# Patient Record
Sex: Female | Born: 1995 | Race: Black or African American | Hispanic: No | Marital: Single | Smoking: Current some day smoker
Health system: Southern US, Community
[De-identification: ages and names within clinical notes are randomized; demographics above are authoritative.]

## PROBLEM LIST (undated history)

## (undated) ENCOUNTER — Inpatient Hospital Stay: Payer: Self-pay

## (undated) DIAGNOSIS — F419 Anxiety disorder, unspecified: Secondary | ICD-10-CM

## (undated) DIAGNOSIS — G4700 Insomnia, unspecified: Secondary | ICD-10-CM

## (undated) HISTORY — PX: NO PAST SURGERIES: SHX2092

---
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
## 2016-01-19 ENCOUNTER — Encounter: Payer: Self-pay | Admitting: *Deleted

## 2016-01-19 ENCOUNTER — Emergency Department
Admission: EM | Admit: 2016-01-19 | Discharge: 2016-01-19 | Disposition: A | Discharge status: Home / Self Care | Payer: Self-pay | Attending: Emergency Medicine | Admitting: Emergency Medicine

## 2016-01-19 DIAGNOSIS — O039 Complete or unspecified spontaneous abortion without complication: Secondary | ICD-10-CM | POA: Insufficient documentation

## 2016-01-19 DIAGNOSIS — Z880 Allergy status to penicillin: Secondary | ICD-10-CM | POA: Insufficient documentation

## 2016-01-19 DIAGNOSIS — Z87891 Personal history of nicotine dependence: Secondary | ICD-10-CM | POA: Insufficient documentation

## 2016-01-19 DIAGNOSIS — Z79899 Other long term (current) drug therapy: Secondary | ICD-10-CM | POA: Insufficient documentation

## 2016-01-19 MED ORDER — MORPHINE SULFATE (PF) 4 MG/ML IV SOLN
4.0000 mg | Freq: Once | INTRAVENOUS | Status: AC
  Administered 2016-01-19: 4 mg via INTRAVENOUS

## 2016-01-19 MED ORDER — MISOPROSTOL 200 MCG PO TABS
800.0000 ug | ORAL_TABLET | Freq: Once | ORAL | Status: AC
  Administered 2016-01-19: 800 ug via ORAL
  Filled 2016-01-19: qty 4

## 2016-01-19 MED ORDER — MORPHINE SULFATE (PF) 4 MG/ML IV SOLN
INTRAVENOUS | Status: AC
  Filled 2016-01-19: qty 1

## 2016-01-19 NOTE — ED Notes (Signed)
Placenta delivered and placed in formalin container by dr Bonney Aid.  Mother continues to hold fetus.  Lower abdomen massaged by dr.staebler.

## 2016-01-19 NOTE — Discharge Instructions (Signed)
Please seek medical attention for any high fevers, chest pain, shortness of breath, change in behavior, persistent vomiting, bloody stool or any other new or concerning symptoms.   Miscarriage A miscarriage is the sudden loss of an unborn baby (fetus) before the 20th week of pregnancy. Most miscarriages happen in the first 3 months of pregnancy. Sometimes, it happens before a woman even knows she is pregnant. A miscarriage is also called a "spontaneous miscarriage" or "early pregnancy loss." Having a miscarriage can be an emotional experience. Talk with your caregiver about any questions you may have about miscarrying, the grieving process, and your future pregnancy plans. CAUSES   Problems with the fetal chromosomes that make it impossible for the baby to develop normally. Problems with the baby's genes or chromosomes are most often the result of errors that occur, by chance, as the embryo divides and grows. The problems are not inherited from the parents.  Infection of the cervix or uterus.   Hormone problems.   Problems with the cervix, such as having an incompetent cervix. This is when the tissue in the cervix is not strong enough to hold the pregnancy.   Problems with the uterus, such as an abnormally shaped uterus, uterine fibroids, or congenital abnormalities.   Certain medical conditions.   Smoking, drinking alcohol, or taking illegal drugs.   Trauma.  Often, the cause of a miscarriage is unknown.  SYMPTOMS   Vaginal bleeding or spotting, with or without cramps or pain.  Pain or cramping in the abdomen or lower back.  Passing fluid, tissue, or blood clots from the vagina. DIAGNOSIS  Your caregiver will perform a physical exam. You may also have an ultrasound to confirm the miscarriage. Blood or urine tests may also be ordered. TREATMENT   Sometimes, treatment is not necessary if you naturally pass all the fetal tissue that was in the uterus. If some of the fetus or  placenta remains in the body (incomplete miscarriage), tissue left behind may become infected and must be removed. Usually, a dilation and curettage (D and C) procedure is performed. During a D and C procedure, the cervix is widened (dilated) and any remaining fetal or placental tissue is gently removed from the uterus.  Antibiotic medicines are prescribed if there is an infection. Other medicines may be given to reduce the size of the uterus (contract) if there is a lot of bleeding.  If you have Rh negative blood and your baby was Rh positive, you will need a Rh immunoglobulin shot. This shot will protect any future baby from having Rh blood problems in future pregnancies. HOME CARE INSTRUCTIONS   Your caregiver may order bed rest or may allow you to continue light activity. Resume activity as directed by your caregiver.  Have someone help with home and family responsibilities during this time.   Keep track of the number of sanitary pads you use each day and how soaked (saturated) they are. Write down this information.   Do not use tampons. Do not douche or have sexual intercourse until approved by your caregiver.   Only take over-the-counter or prescription medicines for pain or discomfort as directed by your caregiver.   Do not take aspirin. Aspirin can cause bleeding.   Keep all follow-up appointments with your caregiver.   If you or your partner have problems with grieving, talk to your caregiver or seek counseling to help cope with the pregnancy loss. Allow enough time to grieve before trying to get pregnant again.  SEEK  IMMEDIATE MEDICAL CARE IF:   You have severe cramps or pain in your back or abdomen.  You have a fever.  You pass large blood clots (walnut-sized or larger) ortissue from your vagina. Save any tissue for your caregiver to inspect.   Your bleeding increases.   You have a thick, bad-smelling vaginal discharge.  You become lightheaded, weak, or you  faint.   You have chills.  MAKE SURE YOU:  Understand these instructions.  Will watch your condition.  Will get help right away if you are not doing well or get worse.   This information is not intended to replace advice given to you by your health care provider. Make sure you discuss any questions you have with your health care provider.   Document Released: 05/08/2001 Document Revised: 03/09/2013 Document Reviewed: 01/01/2012 Elsevier Interactive Patient Education [employer]

## 2016-01-19 NOTE — ED Notes (Signed)
Iv inserted by ems en route with normal saline infusing.

## 2016-01-19 NOTE — Progress Notes (Signed)
°   01/19/16 1900  Clinical Encounter Type  Visited With Health care provider  Visit Type Initial  Referral From Nurse  Consult/Referral To Chaplain  Responded to page but was unable to see patient due to her medical event. Waited for family member until called to another emergency. Chaplain [employer] Ext [phone]

## 2016-01-19 NOTE — ED Notes (Signed)
1915, delivery of fetus by dr Bonney Aid. Umbilical  cord clamped by dr Bonney Aid.  Mother holding fetus.  Iv fluids infusing.

## 2016-01-19 NOTE — ED Notes (Signed)
Nursing supervisor stephanie rn in with pt to sign papers for [employer].  Iv fluids infusing.  Pt alert. Calm and cooperative.

## 2016-01-19 NOTE — ED Notes (Addendum)
Pain meds given.  Pt vomiting.  Iv fluids infusing.  No nausea meds at this time per dr Bonney Aid.  Pt continues to deliver fetus.  Dr Bonney Aid at bedside assisting with delivery.

## 2016-01-19 NOTE — ED Notes (Signed)
Fetus placed in shroud infant bag and with identification stickers.  Fetus taken to morgue by stephanie rn nursing supervisor.

## 2016-01-19 NOTE — ED Notes (Addendum)
On arrival to treatment room, g1p0a0.  Pt reports she is approx [redacted] weeks pregnant.  edc-august 2017. Pt with breech presentation, both legs of fetus protruding from vaginal opening.  Labor and delivery nurse jamie and dr Bonney Aid at bedside to assist with delivery.  Iv fluids infusing.

## 2016-01-19 NOTE — ED Notes (Signed)
3rd liter normal saline infusing now.

## 2016-01-19 NOTE — H&P (Signed)
Obstetrics & Gynecology Consult H&P    Consulting Department: Emergency  Consulting Physician: Phineas Semen, MD   Consulting Question: EMS arrival with 14 week fetus partially expullsed   History of Present Illness: Patient is a 20 y.o. G1P0 with no prenatal care to date except for one ER visit at [facility] and should be around 14 week right now.  The patient states she felt a gush of fluid yesterday, started having cramping today and partially passed baby and called EMS.  Minimal bleeding.  The patient has no other medical problems.  She reports increased stress because one of her friend is currently in jail because he is accused as an accessory in a recent shooting.  Review of Systems:10 point review of systems  Past Medical History:  Past Medical History  Diagnosis Date  . Anxiety   . Insomnia     Past Surgical History:  No past surgical history on file.   Obstetric History: G1P0  Family History:  No family history on file.  Social History:  Social History   Social History  . Marital Status: Single    Spouse Name: N/A  . Number of Children: N/A  . Years of Education: N/A   Occupational History  . Not on file.   Social History Main Topics  . Smoking status: Former Smoker    Types: Cigarettes  . Smokeless tobacco: Never Used  . Alcohol Use: Yes  . Drug Use: No  . Sexual Activity: Not on file   Other Topics Concern  . Not on file   Social History Narrative    Allergies:  Allergies  Allergen Reactions  . Penicillins Hives    Medications: Prior to Admission medications   Medication Sig Start Date End Date Taking? Authorizing Provider  benztropine (COGENTIN) 1 MG tablet Take 1 mg by mouth every morning. 04/18/15   Historical Provider, MD    Physical Exam Vitals: Blood pressure 120/84, pulse 100, resp. rate 18, last menstrual period 09/27/2015, SpO2 100 %. General: Painfully contracting HEENT: normocephalic, anicteric Pulmonary: no increased work  of breathing in between contractions Abdomen: soft, non-tender Genitourinary: Fetus expelled to umbilicus, in breech presentation.  The right and left arm are swept down, the patient was administered cytotec po had emesis 20 minutes after administration but delivered shortly thereafter.  Cord clamped and cut, placenta delivered spontaneously and intact.  Baby boy, grossly normal although some thickening/webbing of the neck is noted.  Fingers and toes are fused as are eyelids.  Spine normal, abdomen grossly normal.   Extremities: no edema or erythema  Labs: No results found for this or any previous visit (from the past 72 hour(s)).  Imaging No results found.  Assessment: 20 y.o. G1P0 with spontaneous early 2nd trimester SAB Plan: - Monitor for bleeding over the next hour if stable may gollow up with myself [facility] office 01/26/2016 at 0815 - Discussed disposition of body hospital can dispose as medical waste, be sent to pathology, or cremated at [facility] funeral home although at this gestational age may not have any ashes to return.  We discussed cytogenetics and patient declines.  Vena Austria, MD

## 2016-01-19 NOTE — ED Notes (Signed)
Iv dc'ed.  Discharge inst to pt.  Pt given paper scrubs and socks.  Family here to pick pt up.  Pt alert and calm.  Pt has moderate amount of vaginal bleeding.

## 2016-01-19 NOTE — ED Notes (Addendum)
Pt brought in via ems from work with miscarriage.  Pt reports last night she had a gush of fluid leak out around midnight.  Today, pt had increasing lower abd pain with cramping.   Dr Derrill Kay and dr Bonney Aid in with pt.  Pt alert. Iv in place.

## 2016-01-19 NOTE — ED Notes (Addendum)
meds were given.  Pt continues to cramp and deliver fetus.  Dr Bonney Aid at bedside.  Iv fluids infusing.  Skin warm and dry.

## 2016-01-19 NOTE — ED Provider Notes (Signed)
[facility] Emergency Department Provider Note    ____________________________________________  Time seen: On EMS arrival  I have reviewed the triage vital signs and the nursing notes.   HISTORY  Chief Complaint Miscarriage   History limited by: Not Limited   HPI Robin Wood is a 20 y.o. female who presents to the emergency department today brought in by EMS because of concern for miscarriage. The patient states that she is roughly [redacted] weeks pregnant. This is her first pregnancy. She states that she underwent an ultrasound in an ED but has not established care with an ob/gyn provider at this point. The patient states that she felt like she had some liquid drain yesterday and than today when she went to use the bathroom she saw legs. She has been having somewhat constant lower abdominal and pelvic pain since this started. It does come in waves at times.    Past Medical History  Diagnosis Date  . Anxiety   . Insomnia     There are no active problems to display for this patient.   No past surgical history on file.  Current Outpatient Rx  Name  Route  Sig  Dispense  Refill  . benztropine (COGENTIN) 1 MG tablet   Oral   Take 1 mg by mouth every morning.      1     Allergies Penicillins  No family history on file.  Social History Social History  Substance Use Topics  . Smoking status: Former Smoker    Types: Cigarettes  . Smokeless tobacco: Never Used  . Alcohol Use: Yes    Review of Systems  Constitutional: Negative for fever. Cardiovascular: Negative for chest pain. Respiratory: Negative for shortness of breath. Gastrointestinal: Positive for lower abdominal pain. Neurological: Negative for headaches, focal weakness or numbness.  10-point ROS otherwise negative.  ____________________________________________   PHYSICAL EXAM:  VITAL SIGNS: ED Triage Vitals  Enc Vitals Group     BP 01/19/16 1834 121/89 mmHg     Pulse  Rate 01/19/16 1834 117     Resp 01/19/16 1834 24     Temp --      Temp src --      SpO2 01/19/16 1834 100 %     Weight --      Height --      Head Cir --      Peak Flow --      Pain Score 01/19/16 1835 10   Constitutional: Alert and oriented.  Eyes: Conjunctivae are normal. PERRL. Normal extraocular movements. ENT   Head: Normocephalic and atraumatic.   Nose: No congestion/rhinnorhea.   Mouth/Throat: Mucous membranes are moist.   Neck: No stridor. Hematological/Lymphatic/Immunilogical: No cervical lymphadenopathy. Cardiovascular: Normal rate, regular rhythm.  No murmurs, rubs, or gallops. Respiratory: Normal respiratory effort without tachypnea nor retractions. Breath sounds are clear and equal bilaterally. No wheezes/rales/rhonchi. Gastrointestinal: Soft and nontender. No distention.  Genitourinary: Fetal material present at vulva.  Musculoskeletal: Normal range of motion in all extremities. No joint effusions.  No lower extremity tenderness nor edema. Neurologic:  Normal speech and language. No gross focal neurologic deficits are appreciated.  Skin:  Skin is warm, dry and intact. No rash noted. Psychiatric: Mood and affect are normal. Speech and behavior are normal. Patient exhibits appropriate insight and judgment.  ____________________________________________    LABS (pertinent positives/negatives)  None  ____________________________________________   EKG  None  ____________________________________________    RADIOLOGY  None  ____________________________________________   PROCEDURES  Procedure(s) performed: None  Critical Care performed: No  ____________________________________________   INITIAL IMPRESSION / ASSESSMENT AND PLAN / ED COURSE  Pertinent labs & imaging results that were available during my care of the patient were reviewed by me and considered in my medical decision making (see chart for details).  Patient presented to the  emergency department today because of concerns for miscarriage. On exam patient did have fetal material present. OB/GYN was present on initial exam and did help with the miscarriage. Cytotec was given. The patient did pass the fetus. The patient was then discharged after observation and when bleeding was minimal.  ____________________________________________   FINAL CLINICAL IMPRESSION(S) / ED DIAGNOSES  Final diagnoses:  Spontaneous abortion     Phineas Semen, MD 01/19/16 [phone]

## 2016-01-19 NOTE — ED Notes (Signed)
Pt continues to have moderate amount of vaginal bright red bleeding.  Iv fluids infusing.

## 2016-01-19 NOTE — ED Notes (Signed)
Dr Derrill Kay in with pt to examine.  Pt now eating dinner tray and drinking ginger ale while sitting up.

## 2016-01-19 NOTE — ED Notes (Signed)
[facility] referral number [phone] Jonetta Osgood contact person at  [facility].

## 2016-01-23 LAB — SURGICAL PATHOLOGY

## 2018-04-16 ENCOUNTER — Observation Stay
Admission: EM | Admit: 2018-04-16 | Discharge: 2018-04-16 | Disposition: A | Discharge status: Home / Self Care | Payer: Medicaid Other | Attending: Obstetrics and Gynecology | Admitting: Obstetrics and Gynecology

## 2018-04-16 DIAGNOSIS — Z880 Allergy status to penicillin: Secondary | ICD-10-CM | POA: Insufficient documentation

## 2018-04-16 DIAGNOSIS — O99342 Other mental disorders complicating pregnancy, second trimester: Secondary | ICD-10-CM | POA: Insufficient documentation

## 2018-04-16 DIAGNOSIS — Z87891 Personal history of nicotine dependence: Secondary | ICD-10-CM | POA: Insufficient documentation

## 2018-04-16 DIAGNOSIS — Z3A27 27 weeks gestation of pregnancy: Secondary | ICD-10-CM | POA: Insufficient documentation

## 2018-04-16 DIAGNOSIS — O4702 False labor before 37 completed weeks of gestation, second trimester: Secondary | ICD-10-CM | POA: Diagnosis present

## 2018-04-16 DIAGNOSIS — F419 Anxiety disorder, unspecified: Secondary | ICD-10-CM | POA: Diagnosis not present

## 2018-04-16 LAB — CBC
HEMATOCRIT: 30.1 % — AB (ref 35.0–47.0)
Hemoglobin: 10.1 g/dL — ABNORMAL LOW (ref 12.0–16.0)
MCH: 26.8 pg (ref 26.0–34.0)
MCHC: 33.4 g/dL (ref 32.0–36.0)
MCV: 80.2 fL (ref 80.0–100.0)
Platelets: 265 10*3/uL (ref 150–440)
RBC: 3.76 MIL/uL — AB (ref 3.80–5.20)
RDW: 13.5 % (ref 11.5–14.5)
WBC: 6.6 10*3/uL (ref 3.6–11.0)

## 2018-04-16 LAB — URINE DRUG SCREEN, QUALITATIVE (ARMC ONLY)
AMPHETAMINES, UR SCREEN: NOT DETECTED
Barbiturates, Ur Screen: NOT DETECTED
Benzodiazepine, Ur Scrn: NOT DETECTED
Cannabinoid 50 Ng, Ur ~~LOC~~: NOT DETECTED
Cocaine Metabolite,Ur ~~LOC~~: NOT DETECTED
MDMA (ECSTASY) UR SCREEN: NOT DETECTED
Methadone Scn, Ur: NOT DETECTED
Opiate, Ur Screen: NOT DETECTED
PHENCYCLIDINE (PCP) UR S: NOT DETECTED
Tricyclic, Ur Screen: NOT DETECTED

## 2018-04-16 NOTE — Discharge Summary (Signed)
Physician Final Progress Note  Patient ID: Robin Wood MRN: [id number] DOB/AGE: 07/12/1996 22 y.o.  Admit date: 04/16/2018 Admitting provider: Vena Austria, MD Discharge date: 04/16/2018   Admission Diagnoses: lower right side abdominal pain  Discharge Diagnoses:  Active Problems:   Indication for care in labor and delivery, antepartum 22 yo G2 P0010 female at [redacted]w[redacted]d by 11 week ultrasound with reactive NST, not in labor, round ligament pain  History of Present Illness: The patient is a 22 y.o. female G2P0010 at [redacted]w[redacted]d who presents for lower right side abdominal pain that began yesterday morning. The pain level has increased since it began. She admits positive fetal movement. She denies contractions, leakage of fluid, vaginal bleeding.   Her pregnancy has been complicated by limited care. She has a history of 14 week miscarriage 2 years ago. She denies complications with this pregnancy.   Past Medical History:  Diagnosis Date  . Anxiety   . Insomnia   . Medical history non-contributory     Past Surgical History:  Procedure Laterality Date  . NO PAST SURGERIES      No current facility-administered medications on file prior to encounter.    Current Outpatient Medications on File Prior to Encounter  Medication Sig Dispense Refill  . benztropine (COGENTIN) 1 MG tablet Take 1 mg by mouth every morning.  1    Allergies  Allergen Reactions  . Penicillins Hives    Social History   Socioeconomic History  . Marital status: Single    Spouse name: Not on file  . Number of children: Not on file  . Years of education: Not on file  . Highest education level: Not on file  Occupational History  . Not on file  Social Needs  . Financial resource strain: Not on file  . Food insecurity:    Worry: Not on file    Inability: Not on file  . Transportation needs:    Medical: Not on file    Non-medical: Not on file  Tobacco Use  . Smoking status: Former Smoker    Types:  Cigarettes    Last attempt to quit: 12/17/2017    Years since quitting: 0.3  . Smokeless tobacco: Never Used  Substance and Sexual Activity  . Alcohol use: Yes  . Drug use: No  . Sexual activity: Yes    Birth control/protection: Injection    Comment: Depo  Lifestyle  . Physical activity:    Days per week: Not on file    Minutes per session: Not on file  . Stress: Not on file  Relationships  . Social connections:    Talks on phone: Not on file    Gets together: Not on file    Attends religious service: Not on file    Active member of club or organization: Not on file    Attends meetings of clubs or organizations: Not on file    Relationship status: Not on file  . Intimate partner violence:    Fear of current or ex partner: Not on file    Emotionally abused: Not on file    Physically abused: Not on file    Forced sexual activity: Not on file  Other Topics Concern  . Not on file  Social History Narrative  . Not on file    Physical Exam: BP 109/66   Pulse 89   Temp 98 F (36.7 C) (Oral)   Resp 16   Ht  (1.549 m)   Wt 102 lb (46.3 kg)  LMP 09/18/2017   BMI 19.27 kg/m   Gen: NAD CV: RRR Pulm: CTAB Pelvic: deferred Toco: negative Fetal well being: 145 bpm, moderate variability, +accelerations, -decelerations Ext: no edema  Consults: None  Significant Findings/ Diagnostic Studies: labs:   Results for CATORI, PANOZZO (MRN [id number]) as of 04/16/2018 09:43  Ref. Range 04/16/2018 08:45 04/16/2018 08:56  WBC Latest Ref Range: 3.6 - 11.0 K/uL  6.6  RBC Latest Ref Range: 3.80 - 5.20 MIL/uL  3.76 (L)  Hemoglobin Latest Ref Range: 12.0 - 16.0 g/dL  40.9 (L)  HCT Latest Ref Range: 35.0 - 47.0 %  30.1 (L)  MCV Latest Ref Range: 80.0 - 100.0 fL  80.2  MCH Latest Ref Range: 26.0 - 34.0 pg  26.8  MCHC Latest Ref Range: 32.0 - 36.0 g/dL  81.1  RDW Latest Ref Range: 11.5 - 14.5 %  13.5  Platelets Latest Ref Range: 150 - 440 K/uL  265  Amphetamines, Ur Screen Latest Ref  Range: NONE DETECTED  NONE DETECTED   Barbiturates, Ur Screen Latest Ref Range: NONE DETECTED  NONE DETECTED   Benzodiazepine, Ur Scrn Latest Ref Range: NONE DETECTED  NONE DETECTED   Cocaine Metabolite,Ur [place] Latest Ref Range: NONE DETECTED  NONE DETECTED   Methadone Scn, Ur Latest Ref Range: NONE DETECTED  NONE DETECTED   MDMA (Ecstasy)Ur Screen Latest Ref Range: NONE DETECTED  NONE DETECTED   Cannabinoid 50 Ng, Ur [place] Latest Ref Range: NONE DETECTED  NONE DETECTED   Opiate, Ur Screen Latest Ref Range: NONE DETECTED  NONE DETECTED   Phencyclidine (PCP) Ur S Latest Ref Range: NONE DETECTED  NONE DETECTED   Tricyclic, Ur Screen Latest Ref Range: NONE DETECTED  NONE DETECTED     Procedures: NST  Discharge Condition: good  Disposition: Discharge disposition: 01-Home or Self Care       Diet: Regular diet  Discharge Activity: Activity as tolerated  Discharge Instructions    Discharge activity:  No Restrictions   Complete by:  As directed    Discharge diet:  No restrictions   Complete by:  As directed    Discharge instructions   Complete by:  As directed    Go to prenatal care provider for regular care during pregnancy   No sexual activity restrictions   Complete by:  As directed    Notify physician for a general feeling that "something is not right"   Complete by:  As directed    Notify physician for increase or change in vaginal discharge   Complete by:  As directed    Notify physician for intestinal cramps, with or without diarrhea, sometimes described as "gas pain"   Complete by:  As directed    Notify physician for leaking of fluid   Complete by:  As directed    Notify physician for low, dull backache, unrelieved by heat or Tylenol   Complete by:  As directed    Notify physician for menstrual like cramps   Complete by:  As directed    Notify physician for pelvic pressure   Complete by:  As directed    Notify physician for uterine contractions.  These may be painless  and feel like the uterus is tightening or the baby is  "balling up"   Complete by:  As directed    Notify physician for vaginal bleeding   Complete by:  As directed    PRETERM LABOR:  Includes any of the follwing symptoms that occur between 20 - [redacted] weeks gestation.  If these symptoms are not stopped, preterm labor can result in preterm delivery, placing your baby at risk   Complete by:  As directed      Allergies as of 04/16/2018      Reactions   Penicillins Hives      Medication List    TAKE these medications   benztropine 1 MG tablet Commonly known as:  COGENTIN Take 1 mg by mouth every morning.        Total time spent taking care of this patient: 15 minutes  Signed: Tresea Mall, CNM  04/16/2018, 9:54 AM

## 2018-04-16 NOTE — OB Triage Note (Addendum)
Pt brought in by EMS for right sided lower abdominal pain that started around 9am yesterday morning. Pain started yesterday and gets worse laying on back or right side. Pain is rated 7/10. Pain is radiating from front to lower right back. Pt has hx of ovarian cyst. Pt denies LOF or VB and stated positive fetal movement. Monitors applied and assessing.

## 2018-07-03 ENCOUNTER — Observation Stay: Payer: Medicaid Other

## 2018-07-03 ENCOUNTER — Other Ambulatory Visit: Payer: Self-pay

## 2018-07-03 ENCOUNTER — Observation Stay
Admission: EM | Admit: 2018-07-03 | Discharge: 2018-07-04 | Disposition: A | Discharge status: Home / Self Care | Payer: Medicaid Other | Source: Home / Self Care | Admitting: Obstetrics and Gynecology

## 2018-07-03 DIAGNOSIS — A599 Trichomoniasis, unspecified: Secondary | ICD-10-CM | POA: Insufficient documentation

## 2018-07-03 DIAGNOSIS — O471 False labor at or after 37 completed weeks of gestation: Secondary | ICD-10-CM | POA: Insufficient documentation

## 2018-07-03 DIAGNOSIS — O0933 Supervision of pregnancy with insufficient antenatal care, third trimester: Secondary | ICD-10-CM | POA: Insufficient documentation

## 2018-07-03 DIAGNOSIS — A568 Sexually transmitted chlamydial infection of other sites: Secondary | ICD-10-CM

## 2018-07-03 DIAGNOSIS — O98313 Other infections with a predominantly sexual mode of transmission complicating pregnancy, third trimester: Secondary | ICD-10-CM | POA: Insufficient documentation

## 2018-07-03 DIAGNOSIS — O321XX0 Maternal care for breech presentation, not applicable or unspecified: Secondary | ICD-10-CM | POA: Insufficient documentation

## 2018-07-03 DIAGNOSIS — Z3A38 38 weeks gestation of pregnancy: Secondary | ICD-10-CM

## 2018-07-03 DIAGNOSIS — Z364 Encounter for antenatal screening for fetal growth retardation: Secondary | ICD-10-CM

## 2018-07-03 DIAGNOSIS — Z87891 Personal history of nicotine dependence: Secondary | ICD-10-CM

## 2018-07-03 DIAGNOSIS — Z880 Allergy status to penicillin: Secondary | ICD-10-CM

## 2018-07-03 LAB — CHLAMYDIA/NGC RT PCR (ARMC ONLY)
CHLAMYDIA TR: DETECTED — AB
N gonorrhoeae: NOT DETECTED

## 2018-07-03 LAB — URINE DRUG SCREEN, QUALITATIVE (ARMC ONLY)
Amphetamines, Ur Screen: NOT DETECTED
BARBITURATES, UR SCREEN: NOT DETECTED
Cannabinoid 50 Ng, Ur ~~LOC~~: NOT DETECTED
Cocaine Metabolite,Ur ~~LOC~~: NOT DETECTED
MDMA (Ecstasy)Ur Screen: NOT DETECTED
METHADONE SCREEN, URINE: NOT DETECTED
Opiate, Ur Screen: NOT DETECTED
Phencyclidine (PCP) Ur S: NOT DETECTED
TRICYCLIC, UR SCREEN: NOT DETECTED

## 2018-07-03 LAB — WET PREP, GENITAL
Clue Cells Wet Prep HPF POC: NONE SEEN
Sperm: NONE SEEN
Yeast Wet Prep HPF POC: NONE SEEN

## 2018-07-03 MED ORDER — METRONIDAZOLE 500 MG PO TABS
2000.0000 mg | ORAL_TABLET | ORAL | Status: AC
  Administered 2018-07-03: 2000 mg via ORAL
  Filled 2018-07-03: qty 4

## 2018-07-03 MED ORDER — AZITHROMYCIN 500 MG PO TABS
1000.0000 mg | ORAL_TABLET | ORAL | Status: AC
  Administered 2018-07-03: 1000 mg via ORAL
  Filled 2018-07-03: qty 2

## 2018-07-03 NOTE — OB Triage Note (Signed)
Pt is a G2P0 at 5810w5d that presents from the ED complaining of "being past due, and feeling pressure with some pains. I don't feel like shes coming though." Initial Fht 145 with monitors applied and assessing. Pt denies VB, LOF, and states positive FM.

## 2018-07-04 DIAGNOSIS — O321XX0 Maternal care for breech presentation, not applicable or unspecified: Secondary | ICD-10-CM

## 2018-07-04 DIAGNOSIS — Z3A38 38 weeks gestation of pregnancy: Secondary | ICD-10-CM

## 2018-07-04 DIAGNOSIS — R109 Unspecified abdominal pain: Secondary | ICD-10-CM

## 2018-07-04 DIAGNOSIS — O0933 Supervision of pregnancy with insufficient antenatal care, third trimester: Secondary | ICD-10-CM

## 2018-07-04 DIAGNOSIS — O9989 Other specified diseases and conditions complicating pregnancy, childbirth and the puerperium: Secondary | ICD-10-CM | POA: Diagnosis not present

## 2018-07-04 DIAGNOSIS — O98313 Other infections with a predominantly sexual mode of transmission complicating pregnancy, third trimester: Secondary | ICD-10-CM | POA: Diagnosis not present

## 2018-07-04 DIAGNOSIS — A599 Trichomoniasis, unspecified: Secondary | ICD-10-CM

## 2018-07-04 DIAGNOSIS — A562 Chlamydial infection of genitourinary tract, unspecified: Secondary | ICD-10-CM

## 2018-07-04 LAB — GROUP B STREP BY PCR: GROUP B STREP BY PCR: POSITIVE — AB

## 2018-07-04 NOTE — Discharge Instructions (Signed)
Return to hospital for leaking of fluid, vaginal bleeding, decreased fetal movement, or painful contractions. Drink plenty of water, at least 8-10 glasses per day. May use tylenol per package instruction as needed for pain.

## 2018-07-04 NOTE — Discharge Summary (Signed)
See discharge summary

## 2018-07-04 NOTE — Discharge Summary (Addendum)
Physician Discharge Summary  Patient ID: Robin Wood MRN: [id number] DOB/AGE: [birth year] 22 y.o.  Admit date: 07/03/2018 Admitting provider: Conard Novak, MD Discharge date: 07/04/2018   Admission Diagnoses:  1) intrauterine pregnancy at [redacted]w[redacted]d  2) no prenatal care this pregnancy 3) abdominal pain, concern for labor  Discharge Diagnoses:  1) intrauterine pregnancy at [redacted]w[redacted]d  2) no prenatal care this pregnancy 3) abdominal pain, concern for labor - false labor 4) breech presentation 5) chlamydia  6) trichomonas  History of Present Illness: The patient is a 22 y.o. female G2P0010 at [redacted]w[redacted]d by 12 week ultrasound at [facility] who presents for regular uterine contractions that have been occurring off and on for the past day.  She has had no prenatal care and is concerned that she is past her due date, as well.  She thought her due date was 8/2. However, based on an ultrasound found in Care Everywhere, her due date is 07/12/18.  She notes +FM, no LOF, and no vaginal bleeding.  She was diagnosed with trichomonas and chlamydia in the ER at [facility] on 12/25/17.  She states that she did not take the antibiotics for either medication.   Hospital Course:  She was admitted for observation.  Her cervix was found to be 1 cm.  An initial bedside ultrasound showed the fetus in complete breech presentation.  A rough growth ultrasound was performed and the growth percentile was < 10th%ile.  So, a formal ultrasound was performed.  This showed the growth to be at 18th %ile with AFI 9.9 cm, and the fetal breech presentation was confirmed.   Given the findings at [facility] for STDs, a repeat wet prep and GC/Chlamydia test was performed and trichomonas and chlamydia were both confirmed. She was given the single-dose antibiotics for each. She did have an episode of emesis about 30 minutes after she took them.  She was re-dosed on azithromycin and tolerated this well. However, she did not want to take another dose of  flagyl.  GBS was also tested and was positive by PCR.  The fetal tracing was category 1 except for one instance where there was a break in the tracing and it appeared she had a fetal deceleration to the 60s (same as maternal HR at the time) with quick return to baseline with moderate variability and accelerations. Given that it was unclear whether this was a true deceleration or the tracing was maternal, she was further monitored for an additional 5 hours. She had a category 1 and reactive tracing this entire time.  She was counseled regarding ECV versus c-section. She did not want to have an ECV performed.  The near-term schedule is full for c-sections.  So, she will return Saturday for an NST and scheduling of her c-section.  She was given the usual labor precautions. The importance of follow up was reiterated to her, given her lack of prenatal care she received in her pregnancy.  Her blood type is noted to be O+ on 12/25/2017 at [facility].  Past Medical History:  Diagnosis Date  . Anxiety   . Insomnia   . Medical history non-contributory     Past Surgical History:  Procedure Laterality Date  . NO PAST SURGERIES      No current facility-administered medications on file prior to encounter.    Current Outpatient Medications on File Prior to Encounter  Medication Sig Dispense Refill  . Prenatal Vit-Fe Fumarate-FA (PRENATAL MULTIVITAMIN) TABS tablet Take 1 tablet by mouth daily  at 12 noon.      Allergies  Allergen Reactions  . Penicillins Hives    Social History   Socioeconomic History  . Marital status: Single    Spouse name: Not on file  . Number of children: 0  . Years of education: Not on file  . Highest education level: 11th grade  Occupational History  . Not on file  Social Needs  . Financial resource strain: Not hard at all  . Food insecurity:    Worry: Never true    Inability: Never true  . Transportation needs:    Medical: Yes    Non-medical: Yes  Tobacco Use  .  Smoking status: Former Smoker    Types: Cigarettes    Last attempt to quit: 12/17/2017    Years since quitting: 0.5  . Smokeless tobacco: Never Used  Substance and Sexual Activity  . Alcohol use: Not Currently    Frequency: Never  . Drug use: No  . Sexual activity: Yes    Birth control/protection: Injection    Comment: Depo  Lifestyle  . Physical activity:    Days per week: 5 days    Minutes per session: 30 min  . Stress: Not at all  Relationships  . Social connections:    Talks on phone: More than three times a week    Gets together: More than three times a week    Attends religious service: Never    Active member of club or organization: No    Attends meetings of clubs or organizations: Never    Relationship status: Never married  . Intimate partner violence:    Fear of current or ex partner: No    Emotionally abused: No    Physically abused: No    Forced sexual activity: No  Other Topics Concern  . Not on file  Social History Narrative  . Not on file   Review of Systems  Constitutional: Negative.   HENT: Negative.   Eyes: Negative.   Respiratory: Negative.   Cardiovascular: Negative.   Gastrointestinal: Positive for abdominal pain (mild contractions at presentation). Negative for blood in stool, constipation, diarrhea, heartburn, melena, nausea and vomiting.  Genitourinary: Negative.   Musculoskeletal: Negative.   Skin: Negative.   Neurological: Negative.   Psychiatric/Behavioral: Negative.      Physical Exam: BP 115/76 (BP Location: Right Arm)   Pulse 100   Temp 98.3 F (36.8 C) (Oral)   Resp 16   Ht 5\' 1"  (1.549 m)   Wt 47.6 kg   LMP 09/18/2017   BMI 19.84 kg/m   Physical Exam  Constitutional: She is oriented to person, place, and time. She appears well-developed and well-nourished. No distress.  HENT:  Head: Normocephalic and atraumatic.  Eyes: Conjunctivae are normal. No scleral icterus.  Cardiovascular: Normal rate and regular rhythm.   Pulmonary/Chest: Effort normal and breath sounds normal. No respiratory distress. She has no wheezes. She has no rales.  Abdominal: Soft. There is no tenderness.  gravid  Musculoskeletal: Normal range of motion. She exhibits no edema.  Neurological: She is alert and oriented to person, place, and time. No cranial nerve deficit.  Skin: Skin is warm and dry.  Psychiatric: She has a normal mood and affect. Her behavior is normal. Judgment normal.  CVX: 1 cm per RN  Consults: None  Significant Findings/ Diagnostic Studies:  Lab Results  Component Value Date   TRICHWETPREP PRESENT (A) 07/03/2018   CLUECELLS NONE SEEN 07/03/2018   WBCWETPREP FEW (A) 07/03/2018  YEASTWETPREP NONE SEEN 07/03/2018    Lab Results  Component Value Date   CHLAMYDIA DETECTED (A) 07/03/2018   NGONORRHOEAE NOT DETECTED 07/03/2018   UDS: negative  Procedures:  1) bedside ultrasound for presentation  2) formal ultrasound (See below for results) 3) NST Baseline FHR: 130 beats/min Variability: moderate Accelerations: present Decelerations: absent Tocometry: irregular with periods of 3-4 q 10 and other periods of infrequent to absent contractions  Interpretation:  INDICATIONS: rule out uterine contractions RESULTS:  A NST procedure was performed with FHR monitoring and a normal baseline established, appropriate time of 20-40 minutes of evaluation, and accels >2 seen w 15x15 characteristics.  Results show a REACTIVE NST.    US Ob Comp + 14 Wk  Result Date: 07/03/2018 CLINICAL DATA:  Fetal growth restriction. No prenatal care. Gestational age by first ultrasound is 38 weeks 5 days. EDC by first ultrasound is 07/12/2018. EXAM: OBSTETRICAL ULTRASOUND >14 WKS FINDINGS: Number of Fetuses: 1 Heart Rate:  135 bpm Movement: Present Presentation: Present Previa: Anterior Placental Location: None Amniotic Fluid (Subjective): Normal Amniotic Fluid (Objective): Vertical pocket 3.1cm AFI 9.9 cm (5%ile= 7.2 cm, 95%= 22.6 cm  for 39 wks) FETAL BIOMETRY BPD:  8.9cm 35w 6d HC:    32.9cm 37w 3d AC:   32.8cm 36w 5d FL:   7.1cm 36w 3d Current Mean GA: 36w 2d [place] EDC: 07/29/2018 Estimated Fetal Weight:  2989g 18.2%ile FETAL ANATOMY Lateral Ventricles: Appears normal Thalami/CSP: Appears normal Posterior Fossa:  Not visualized Nuchal Region: Not visualized Upper Lip: Not visualized Spine: Appears normal 4 Chamber Heart on Left: Not visualized LVOT: Not visualized RVOT: Not visualized Stomach on Left: Appears normal 3 Vessel Cord: Not visualized Cord Insertion site: Not visualized Kidneys: Appears normal Bladder: Appears normal Extremities: Not visualized Sex: Not visualized Technically difficult due to: Advanced gestational age Maternal Findings: Cervix:  Not evaluated IMPRESSION: 1. Single living intrauterine fetus in breech presentation. 2. Fetal measurements lag behind assigned gestational age. 3. Estimated fetal weight is in the 18th percentile for gestational age of 110 weeks. 4. Amniotic fluid volume in the LOWER ranges of normal. Electronically Signed   By: Norva Pavlov M.D.   On: 07/03/2018 18:32   Discharge Condition: stable  Disposition: Discharge disposition: 01-Home or Self Care     Diet: Regular diet  Discharge Activity: Activity as tolerated   Allergies as of 07/04/2018      Reactions   Penicillins Hives      Medication List    STOP taking these medications   benztropine 1 MG tablet Commonly known as:  COGENTIN     TAKE these medications   prenatal multivitamin Tabs tablet Take 1 tablet by mouth daily at 12 noon.      Follow-up Information    [facility]. Go on 07/05/2018.   Why:  Return to Labor and Delivery at [facility] on Saturday 07/05/18 for non-stress test. Go through the emergency department to be registered first. Contact information: [address] [id number] [address] [place] [address] [phone]         Total time spent taking  care of this patient: 60 minutes  Signed: Thomasene Mohair, MD  07/04/2018, 2:42 AM

## 2018-07-04 NOTE — OB Triage Note (Signed)
Patient given discharge instruction per AVS. Verbalized understanding and agreed to plan of care. Patient discharged, ambulatory, in stable condition.

## 2018-07-05 ENCOUNTER — Inpatient Hospital Stay
Admission: EM | Admit: 2018-07-05 | Discharge: 2018-07-08 | DRG: 787 | Disposition: A | Discharge status: Home / Self Care | Payer: Medicaid Other | Attending: Obstetrics and Gynecology | Admitting: Obstetrics and Gynecology

## 2018-07-05 ENCOUNTER — Inpatient Hospital Stay: Payer: Medicaid Other | Admitting: Anesthesiology

## 2018-07-05 ENCOUNTER — Other Ambulatory Visit: Payer: Self-pay

## 2018-07-05 ENCOUNTER — Encounter: Payer: Self-pay | Admitting: *Deleted

## 2018-07-05 ENCOUNTER — Encounter
Admission: EM | Disposition: A | Discharge status: Home / Self Care | Payer: Self-pay | Source: Home / Self Care | Attending: Obstetrics and Gynecology

## 2018-07-05 DIAGNOSIS — O329XX0 Maternal care for malpresentation of fetus, unspecified, not applicable or unspecified: Secondary | ICD-10-CM | POA: Diagnosis present

## 2018-07-05 DIAGNOSIS — Z3A39 39 weeks gestation of pregnancy: Secondary | ICD-10-CM | POA: Diagnosis not present

## 2018-07-05 DIAGNOSIS — O411019 Infection of amniotic sac and membranes, unspecified, first trimester, other fetus: Secondary | ICD-10-CM | POA: Diagnosis present

## 2018-07-05 DIAGNOSIS — A568 Sexually transmitted chlamydial infection of other sites: Secondary | ICD-10-CM | POA: Diagnosis present

## 2018-07-05 DIAGNOSIS — O99824 Streptococcus B carrier state complicating childbirth: Secondary | ICD-10-CM | POA: Diagnosis present

## 2018-07-05 DIAGNOSIS — O0933 Supervision of pregnancy with insufficient antenatal care, third trimester: Secondary | ICD-10-CM

## 2018-07-05 DIAGNOSIS — A599 Trichomoniasis, unspecified: Secondary | ICD-10-CM | POA: Diagnosis present

## 2018-07-05 DIAGNOSIS — A749 Chlamydial infection, unspecified: Secondary | ICD-10-CM | POA: Diagnosis present

## 2018-07-05 DIAGNOSIS — O321XX0 Maternal care for breech presentation, not applicable or unspecified: Secondary | ICD-10-CM | POA: Diagnosis present

## 2018-07-05 DIAGNOSIS — Z3A38 38 weeks gestation of pregnancy: Secondary | ICD-10-CM

## 2018-07-05 DIAGNOSIS — A5901 Trichomonal vulvovaginitis: Secondary | ICD-10-CM | POA: Diagnosis present

## 2018-07-05 DIAGNOSIS — O471 False labor at or after 37 completed weeks of gestation: Secondary | ICD-10-CM | POA: Diagnosis present

## 2018-07-05 DIAGNOSIS — Z880 Allergy status to penicillin: Secondary | ICD-10-CM

## 2018-07-05 DIAGNOSIS — O98313 Other infections with a predominantly sexual mode of transmission complicating pregnancy, third trimester: Secondary | ICD-10-CM | POA: Diagnosis present

## 2018-07-05 DIAGNOSIS — O9081 Anemia of the puerperium: Secondary | ICD-10-CM | POA: Diagnosis not present

## 2018-07-05 DIAGNOSIS — O9832 Other infections with a predominantly sexual mode of transmission complicating childbirth: Secondary | ICD-10-CM | POA: Diagnosis present

## 2018-07-05 DIAGNOSIS — O1495 Unspecified pre-eclampsia, complicating the puerperium: Secondary | ICD-10-CM | POA: Diagnosis present

## 2018-07-05 DIAGNOSIS — Z87891 Personal history of nicotine dependence: Secondary | ICD-10-CM | POA: Diagnosis not present

## 2018-07-05 DIAGNOSIS — D62 Acute posthemorrhagic anemia: Secondary | ICD-10-CM | POA: Diagnosis not present

## 2018-07-05 DIAGNOSIS — O98813 Other maternal infectious and parasitic diseases complicating pregnancy, third trimester: Secondary | ICD-10-CM

## 2018-07-05 LAB — URINE DRUG SCREEN, QUALITATIVE (ARMC ONLY)
AMPHETAMINES, UR SCREEN: NOT DETECTED
Barbiturates, Ur Screen: NOT DETECTED
Cannabinoid 50 Ng, Ur ~~LOC~~: NOT DETECTED
Cocaine Metabolite,Ur ~~LOC~~: NOT DETECTED
MDMA (ECSTASY) UR SCREEN: NOT DETECTED
Methadone Scn, Ur: NOT DETECTED
Opiate, Ur Screen: NOT DETECTED
Phencyclidine (PCP) Ur S: NOT DETECTED
TRICYCLIC, UR SCREEN: NOT DETECTED

## 2018-07-05 LAB — CBC
HEMATOCRIT: 27.3 % — AB (ref 35.0–47.0)
Hemoglobin: 8.8 g/dL — ABNORMAL LOW (ref 12.0–16.0)
MCH: 24 pg — AB (ref 26.0–34.0)
MCHC: 32.3 g/dL (ref 32.0–36.0)
MCV: 74.2 fL — AB (ref 80.0–100.0)
Platelets: 245 10*3/uL (ref 150–440)
RBC: 3.68 MIL/uL — ABNORMAL LOW (ref 3.80–5.20)
RDW: 16.6 % — AB (ref 11.5–14.5)
WBC: 6.8 10*3/uL (ref 3.6–11.0)

## 2018-07-05 LAB — RAPID HIV SCREEN (HIV 1/2 AB+AG)
HIV 1/2 Antibodies: NONREACTIVE
HIV-1 P24 Antigen - HIV24: NONREACTIVE

## 2018-07-05 LAB — CHLAMYDIA/NGC RT PCR (ARMC ONLY)
CHLAMYDIA TR: DETECTED — AB
N gonorrhoeae: NOT DETECTED

## 2018-07-05 LAB — TYPE AND SCREEN
ABO/RH(D): O POS
Antibody Screen: NEGATIVE

## 2018-07-05 SURGERY — Surgical Case
Anesthesia: Spinal

## 2018-07-05 MED ORDER — MORPHINE SULFATE (PF) 0.5 MG/ML IJ SOLN
INTRAMUSCULAR | Status: DC | PRN
  Administered 2018-07-05: .2 mg via EPIDURAL
  Administered 2018-07-05: 2 mg via INTRAVENOUS

## 2018-07-05 MED ORDER — ONDANSETRON HCL 4 MG/2ML IJ SOLN
INTRAMUSCULAR | Status: DC | PRN
  Administered 2018-07-05: 4 mg via INTRAVENOUS

## 2018-07-05 MED ORDER — NALBUPHINE HCL 10 MG/ML IJ SOLN: 5.0000 mg | INTRAMUSCULAR | Status: DC | PRN

## 2018-07-05 MED ORDER — KETOROLAC TROMETHAMINE 30 MG/ML IJ SOLN
INTRAMUSCULAR | Status: DC | PRN
  Administered 2018-07-05: 30 mg via INTRAVENOUS

## 2018-07-05 MED ORDER — ONDANSETRON HCL 40 MG/20ML IJ SOLN
8.0000 mg | Freq: Four times a day (QID) | INTRAMUSCULAR | Status: DC | PRN
  Filled 2018-07-05: qty 4

## 2018-07-05 MED ORDER — KETOROLAC TROMETHAMINE 30 MG/ML IJ SOLN
INTRAMUSCULAR | Status: AC
  Filled 2018-07-05: qty 1

## 2018-07-05 MED ORDER — ONDANSETRON HCL 4 MG/2ML IJ SOLN: 4.0000 mg | Freq: Three times a day (TID) | INTRAMUSCULAR | Status: DC | PRN

## 2018-07-05 MED ORDER — SODIUM CHLORIDE 0.9% FLUSH: 3.0000 mL | INTRAVENOUS | Status: DC | PRN

## 2018-07-05 MED ORDER — FENTANYL CITRATE (PF) 100 MCG/2ML IJ SOLN: 25.0000 ug | INTRAMUSCULAR | Status: DC | PRN

## 2018-07-05 MED ORDER — COCONUT OIL OIL: 1.0000 "application " | TOPICAL_OIL | Status: DC | PRN

## 2018-07-05 MED ORDER — EPHEDRINE SULFATE 50 MG/ML IJ SOLN
INTRAMUSCULAR | Status: DC | PRN
  Administered 2018-07-05 (×2): 5 mg via INTRAVENOUS

## 2018-07-05 MED ORDER — BUPIVACAINE HCL (PF) 0.5 % IJ SOLN: 5.0000 mL | Freq: Once | INTRAMUSCULAR | Status: DC

## 2018-07-05 MED ORDER — EPHEDRINE SULFATE 50 MG/ML IJ SOLN
INTRAMUSCULAR | Status: AC
  Filled 2018-07-05: qty 1

## 2018-07-05 MED ORDER — LACTATED RINGERS IV BOLUS
1000.0000 mL | Freq: Once | INTRAVENOUS | Status: AC
  Administered 2018-07-05: 1000 mL via INTRAVENOUS

## 2018-07-05 MED ORDER — SENNOSIDES-DOCUSATE SODIUM 8.6-50 MG PO TABS
2.0000 | ORAL_TABLET | ORAL | Status: DC
  Administered 2018-07-06: 2 via ORAL
  Filled 2018-07-05 (×3): qty 2

## 2018-07-05 MED ORDER — BUPIVACAINE HCL (PF) 0.5 % IJ SOLN
5.0000 mL | Freq: Once | INTRAMUSCULAR | Status: DC
  Filled 2018-07-05: qty 30

## 2018-07-05 MED ORDER — DIPHENHYDRAMINE HCL 25 MG PO CAPS: 25.0000 mg | ORAL_CAPSULE | ORAL | Status: DC | PRN

## 2018-07-05 MED ORDER — SIMETHICONE 80 MG PO CHEW
80.0000 mg | CHEWABLE_TABLET | Freq: Three times a day (TID) | ORAL | Status: DC
  Administered 2018-07-06 – 2018-07-08 (×7): 80 mg via ORAL
  Filled 2018-07-05 (×7): qty 1

## 2018-07-05 MED ORDER — SODIUM CHLORIDE 0.9 % IV SOLN
500.0000 mg | INTRAVENOUS | Status: AC
  Administered 2018-07-05: 500 mg via INTRAVENOUS
  Filled 2018-07-05 (×2): qty 500

## 2018-07-05 MED ORDER — OXYTOCIN 40 UNITS IN LACTATED RINGERS INFUSION - SIMPLE MED
INTRAVENOUS | Status: DC | PRN
  Administered 2018-07-05: 1 mL via INTRAVENOUS

## 2018-07-05 MED ORDER — IBUPROFEN 600 MG PO TABS: 600.0000 mg | ORAL_TABLET | Freq: Four times a day (QID) | ORAL | Status: DC

## 2018-07-05 MED ORDER — KETOROLAC TROMETHAMINE 30 MG/ML IJ SOLN: 30.0000 mg | Freq: Four times a day (QID) | INTRAMUSCULAR | Status: AC | PRN

## 2018-07-05 MED ORDER — LACTATED RINGERS IV SOLN
INTRAVENOUS | Status: DC
  Administered 2018-07-05: 125 mL/h via INTRAVENOUS

## 2018-07-05 MED ORDER — LACTATED RINGERS IV SOLN
INTRAVENOUS | Status: DC
Start: 2018-07-05 — End: 2018-07-08

## 2018-07-05 MED ORDER — MENTHOL 3 MG MT LOZG
1.0000 | LOZENGE | OROMUCOSAL | Status: DC | PRN
  Filled 2018-07-05: qty 9

## 2018-07-05 MED ORDER — PRENATAL MULTIVITAMIN CH
1.0000 | ORAL_TABLET | Freq: Every day | ORAL | Status: DC
  Administered 2018-07-06 – 2018-07-08 (×3): 1 via ORAL
  Filled 2018-07-05 (×3): qty 1

## 2018-07-05 MED ORDER — OXYCODONE-ACETAMINOPHEN 5-325 MG PO TABS
1.0000 | ORAL_TABLET | ORAL | Status: DC | PRN
  Administered 2018-07-07 (×3): 1 via ORAL
  Filled 2018-07-05 (×3): qty 1

## 2018-07-05 MED ORDER — OXYTOCIN 10 UNIT/ML IJ SOLN
INTRAMUSCULAR | Status: AC
  Filled 2018-07-05: qty 2

## 2018-07-05 MED ORDER — DIBUCAINE 1 % RE OINT: 1.0000 "application " | TOPICAL_OINTMENT | RECTAL | Status: DC | PRN

## 2018-07-05 MED ORDER — PHENYLEPHRINE HCL 10 MG/ML IJ SOLN
INTRAMUSCULAR | Status: AC
  Filled 2018-07-05: qty 1

## 2018-07-05 MED ORDER — KETOROLAC TROMETHAMINE 30 MG/ML IJ SOLN
30.0000 mg | Freq: Four times a day (QID) | INTRAMUSCULAR | Status: AC | PRN
  Administered 2018-07-06 (×2): 30 mg via INTRAVENOUS
  Filled 2018-07-05 (×3): qty 1

## 2018-07-05 MED ORDER — OXYTOCIN 40 UNITS IN LACTATED RINGERS INFUSION - SIMPLE MED
INTRAVENOUS | Status: AC
Start: 2018-07-05 — End: 2018-07-05
  Filled 2018-07-05: qty 1000

## 2018-07-05 MED ORDER — DIPHENHYDRAMINE HCL 25 MG PO CAPS: 25.0000 mg | ORAL_CAPSULE | Freq: Four times a day (QID) | ORAL | Status: DC | PRN

## 2018-07-05 MED ORDER — OXYTOCIN 40 UNITS IN LACTATED RINGERS INFUSION - SIMPLE MED
2.5000 [IU]/h | INTRAVENOUS | Status: AC
  Administered 2018-07-05: 2.5 [IU]/h via INTRAVENOUS
  Filled 2018-07-05: qty 1000

## 2018-07-05 MED ORDER — NALOXONE HCL 0.4 MG/ML IJ SOLN: 0.4000 mg | INTRAMUSCULAR | Status: DC | PRN

## 2018-07-05 MED ORDER — ONDANSETRON HCL 4 MG/2ML IJ SOLN
INTRAMUSCULAR | Status: AC
  Filled 2018-07-05: qty 2

## 2018-07-05 MED ORDER — CEFAZOLIN SODIUM-DEXTROSE 2-4 GM/100ML-% IV SOLN
2.0000 g | INTRAVENOUS | Status: AC
  Administered 2018-07-05: 2 g via INTRAVENOUS
  Filled 2018-07-05: qty 100

## 2018-07-05 MED ORDER — DIPHENHYDRAMINE HCL 50 MG/ML IJ SOLN: 12.5000 mg | INTRAMUSCULAR | Status: DC | PRN

## 2018-07-05 MED ORDER — MORPHINE SULFATE (PF) 0.5 MG/ML IJ SOLN
INTRAMUSCULAR | Status: AC
  Filled 2018-07-05: qty 10

## 2018-07-05 MED ORDER — SOD CITRATE-CITRIC ACID 500-334 MG/5ML PO SOLN
30.0000 mL | ORAL | Status: AC
  Administered 2018-07-05: 30 mL via ORAL
  Filled 2018-07-05: qty 15

## 2018-07-05 MED ORDER — MEPERIDINE HCL 25 MG/ML IJ SOLN: 6.2500 mg | INTRAMUSCULAR | Status: DC | PRN

## 2018-07-05 MED ORDER — BUPIVACAINE 0.25 % ON-Q PUMP DUAL CATH 400 ML
400.0000 mL | INJECTION | Status: DC
  Filled 2018-07-05: qty 400

## 2018-07-05 MED ORDER — BUPIVACAINE HCL 0.5 % IJ SOLN
INTRAMUSCULAR | Status: DC | PRN
  Administered 2018-07-05: 10 mL

## 2018-07-05 MED ORDER — PHENYLEPHRINE HCL 10 MG/ML IJ SOLN
INTRAMUSCULAR | Status: DC | PRN
  Administered 2018-07-05 (×2): 50 ug via INTRAVENOUS

## 2018-07-05 MED ORDER — OXYCODONE-ACETAMINOPHEN 5-325 MG PO TABS
2.0000 | ORAL_TABLET | ORAL | Status: DC | PRN
  Administered 2018-07-08: 2 via ORAL
  Filled 2018-07-05: qty 2

## 2018-07-05 MED ORDER — BUPIVACAINE IN DEXTROSE 0.75-8.25 % IT SOLN
INTRATHECAL | Status: DC | PRN
  Administered 2018-07-05: 1.6 mL via INTRATHECAL

## 2018-07-05 MED ORDER — ONDANSETRON HCL 4 MG/2ML IJ SOLN: 4.0000 mg | Freq: Once | INTRAMUSCULAR | Status: DC | PRN

## 2018-07-05 MED ORDER — FERROUS SULFATE 325 (65 FE) MG PO TABS
325.0000 mg | ORAL_TABLET | Freq: Two times a day (BID) | ORAL | Status: DC
  Administered 2018-07-06 – 2018-07-08 (×5): 325 mg via ORAL
  Filled 2018-07-05 (×6): qty 1

## 2018-07-05 MED ORDER — WITCH HAZEL-GLYCERIN EX PADS: 1.0000 "application " | MEDICATED_PAD | CUTANEOUS | Status: DC | PRN

## 2018-07-05 MED ORDER — ACETAMINOPHEN 500 MG PO TABS
1000.0000 mg | ORAL_TABLET | Freq: Four times a day (QID) | ORAL | Status: DC
  Administered 2018-07-06: 1000 mg via ORAL
  Filled 2018-07-05 (×2): qty 2

## 2018-07-05 SURGICAL SUPPLY — 29 items
CANISTER SUCT 3000ML PPV (MISCELLANEOUS) ×3 IMPLANT
CATH KIT ON-Q SILVERSOAK 5IN (CATHETERS) ×6 IMPLANT
CLOSURE WOUND 1/2 X4 (GAUZE/BANDAGES/DRESSINGS)
DERMABOND ADVANCED (GAUZE/BANDAGES/DRESSINGS) ×2
DERMABOND ADVANCED .7 DNX12 (GAUZE/BANDAGES/DRESSINGS) ×1 IMPLANT
DRSG OPSITE POSTOP 4X10 (GAUZE/BANDAGES/DRESSINGS) ×3 IMPLANT
DRSG TELFA 3X8 NADH (GAUZE/BANDAGES/DRESSINGS) ×3 IMPLANT
ELECT CAUTERY BLADE 6.4 (BLADE) ×3 IMPLANT
ELECT REM PT RETURN 9FT ADLT (ELECTROSURGICAL) ×3
ELECTRODE REM PT RTRN 9FT ADLT (ELECTROSURGICAL) ×1 IMPLANT
GAUZE SPONGE 4X4 12PLY STRL (GAUZE/BANDAGES/DRESSINGS) ×3 IMPLANT
GLOVE BIO SURGEON STRL SZ7 (GLOVE) ×15 IMPLANT
GLOVE INDICATOR 7.5 STRL GRN (GLOVE) ×3 IMPLANT
GOWN STRL REUS W/ TWL LRG LVL3 (GOWN DISPOSABLE) ×3 IMPLANT
GOWN STRL REUS W/TWL LRG LVL3 (GOWN DISPOSABLE) ×6
NS IRRIG 1000ML POUR BTL (IV SOLUTION) ×3 IMPLANT
PACK C SECTION AR (MISCELLANEOUS) ×3 IMPLANT
PAD OB MATERNITY 4.3X12.25 (PERSONAL CARE ITEMS) ×3 IMPLANT
PAD PREP 24X41 OB/GYN DISP (PERSONAL CARE ITEMS) ×3 IMPLANT
STAPLER INSORB 30 2030 C-SECTI (MISCELLANEOUS) ×3 IMPLANT
STRIP CLOSURE SKIN 1/2X4 (GAUZE/BANDAGES/DRESSINGS) IMPLANT
SUT MNCRL 4-0 (SUTURE) ×2
SUT MNCRL 4-0 27XMFL (SUTURE) ×1
SUT PDS AB 1 TP1 96 (SUTURE) ×3 IMPLANT
SUT PLAIN GUT 0 (SUTURE) IMPLANT
SUT VIC AB 0 CTX 36 (SUTURE) ×4
SUT VIC AB 0 CTX36XBRD ANBCTRL (SUTURE) ×2 IMPLANT
SUTURE MNCRL 4-0 27XMF (SUTURE) ×1 IMPLANT
SWABSTK COMLB BENZOIN TINCTURE (MISCELLANEOUS) IMPLANT

## 2018-07-05 NOTE — Op Note (Signed)
Cesarean Section Operative Note    Robin Wood   07/05/2018   Pre-operative Diagnosis:  1) intrauterine pregnancy at 7520w0d  2) breech presentation in third trimester  Post-operative Diagnosis:  1) intrauterine pregnancy at 1220w0d  2) breech presentation in third trimester   Procedure: Primary Low Transverse Cesarean Section via Pfannenstiel incision with double layer uterine closure  Surgeon: Surgeon(s) and Role:    Conard Novak* Cartrell Bentsen D, MD - Primary   Anesthesia: spinal   Findings:  1) normal appearing gravid uterus, fallopian tubes, and ovaries 2) viable female infant in complete breech presentation with weight of 3,080 grams, APGARs 8 and 9   Estimated Blood Loss: 700 mL  Total IV Fluids: 1,500 ml   Urine Output: 200 mL clear urine at end of procedure  Specimens: none  Complications: no complications  Disposition: PACU - hemodynamically stable.   Maternal Condition: stable   Baby condition / location:  Couplet care / Skin to Skin  Procedure Details:  The patient was seen in the Holding Room. The risks, benefits, complications, treatment options, and expected outcomes were discussed with the patient. The patient concurred with the proposed plan, giving informed consent. identified as Robin NightFrances Dorff and the procedure verified as C-Section Delivery. A Time Out was held and the above information confirmed.   After induction of anesthesia, the patient was draped and prepped in the usual sterile manner. A Pfannenstiel incision was made and carried down through the subcutaneous tissue to the fascia. Fascial incision was made and extended transversely. The fascia was separated from the underlying rectus tissue superiorly and inferiorly. The peritoneum was identified and entered. Peritoneal incision was extended longitudinally. The bladder flap was not freed from the lower uterine segment. A low transverse uterine incision was made and the hysterotomy was extended with  cranial-caudal tension. Delivered from breech (complete) presentation was a 3,080 gram Living newborn infant(s) or Female with Apgar scores of 8 at one minute and 9 at five minutes. The fetal feet were delivered first and the fetus was delivered to the level of the scapulae. Each arm was swept down to delivered the arm prior to the head.  The neck was flexed and the fetal head was delivered without difficulty.  Cord ph was not sent the umbilical cord was clamped and cut cord blood was obtained for evaluation. The placenta was removed Intact and appeared normal. The uterine outline, tubes and ovaries appeared normal.  The uterine incision was closed with running locked sutures of 0 Vicryl.  A second layer of the same suture was thrown in an imbricating fashion.  Hemostasis was assured.  The uterus was returned to the abdomen and the paracolic gutters were cleared of all clots and debris.  The rectus muscles were inspected and found to be hemostatic.  The On-Q catheter pumps were inserted in accordance with the manufacturer's recommendations.  The catheters were inserted approximately 4cm cephelad to the incision line, approximately 1cm apart, straddling the midline.  They were inserted to a depth of the 4th mark. They were positioned superficial to the rectus abdominus muscles and deep to the rectus fascia.    The fascia was then reapproximated with running sutures of 1-0 PDS, looped. The subcuticular closure was performed using 4-0 monocryl. The skin closure was reinforced using benzoin and 1/2" steri-strips.  The On-Q catheters were bolused with 5 mL of 0.5% marcaine plain for a total of 10 mL.  There was high resistance to bolus in the right-most catheter. So, it  was removed after bolus without difficulty.  The catheter was affixed to the skin with surgical skin glue, steri-strips, and tegaderm.    Instrument, sponge, and needle counts were correct prior the abdominal closure and were correct at the  conclusion of the case.  The patient received Ancef 2 gram IV prior to skin incision (within 30 minutes). She also received Azithromycin 500 mg IV prior to skin incision. For VTE prophylaxis she was wearing SCDs throughout the case.    Signed: Conard Novak, MD 07/05/2018 3:23 PM

## 2018-07-05 NOTE — Transfer of Care (Signed)
Immediate Anesthesia Transfer of Care Note  Patient: Robin Wood  Procedure(s) Performed: CESAREAN SECTION (N/A )  Patient Location: PACU and Mother/Baby  Anesthesia Type:Spinal  Level of Consciousness: awake, alert  and oriented  Airway & Oxygen Therapy: Patient Spontanous Breathing  Post-op Assessment: Report given to RN and Post -op Vital signs reviewed and stable  Post vital signs: Reviewed and stable  Last Vitals:  Vitals Value Taken Time  BP 127/91 07/05/2018  3:44 PM  Temp 36.3 C 07/05/2018  3:44 PM  Pulse 53 07/05/2018  3:45 PM  Resp 14 07/05/2018  3:45 PM  SpO2 100 % 07/05/2018  3:45 PM  Vitals shown include unvalidated device data.  Last Pain:  Vitals:   07/05/18 1544  TempSrc: Temporal  PainSc:          Complications: No apparent anesthesia complications

## 2018-07-05 NOTE — Anesthesia Post-op Follow-up Note (Signed)
Anesthesia QCDR form completed.        

## 2018-07-05 NOTE — Anesthesia Procedure Notes (Signed)
Spinal  Patient location during procedure: OR Staffing Anesthesiologist: Reeder Brisby, MD Performed: anesthesiologist  Preanesthetic Checklist Completed: patient identified, site marked, surgical consent, pre-op evaluation, timeout performed, IV checked and risks and benefits discussed Spinal Block Patient position: sitting Prep: ChloraPrep Patient monitoring: heart rate, cardiac monitor, continuous pulse ox and blood pressure Approach: midline Location: L3-4 Injection technique: single-shot Needle Needle type: Pencil-Tip  Needle gauge: 25 G Needle length: 9 cm Assessment Sensory level: T10     

## 2018-07-05 NOTE — Anesthesia Preprocedure Evaluation (Signed)
Anesthesia Evaluation  Patient identified by MRN, date of birth, ID band Patient awake    Reviewed: Allergy & Precautions, NPO status , Patient's Chart, lab work & pertinent test results, reviewed documented beta blocker date and time   Airway Mallampati: II  TM Distance: >3 FB     Dental  (+) Chipped   Pulmonary former smoker,           Cardiovascular      Neuro/Psych Anxiety    GI/Hepatic   Endo/Other    Renal/GU      Musculoskeletal   Abdominal   Peds  Hematology   Anesthesia Other Findings   Reproductive/Obstetrics                             Anesthesia Physical Anesthesia Plan  ASA: II  Anesthesia Plan: Spinal   Post-op Pain Management:    Induction:   PONV Risk Score and Plan:   Airway Management Planned:   Additional Equipment:   Intra-op Plan:   Post-operative Plan:   Informed Consent: I have reviewed the patients History and Physical, chart, labs and discussed the procedure including the risks, benefits and alternatives for the proposed anesthesia with the patient or authorized representative who has indicated his/her understanding and acceptance.     Plan Discussed with: CRNA  Anesthesia Plan Comments:         Anesthesia Quick Evaluation  

## 2018-07-05 NOTE — Lactation Note (Signed)
This note was copied from a baby's chart. Lactation Consultation Note  Patient Name: Robin Wood Today's Date: 07/05/2018 Reason for consult: Initial assessment;Primapara;1st time breastfeeding   Maternal Data Formula Feeding for Exclusion: No Does the patient have breastfeeding experience prior to this delivery?: No  Feeding Feeding Type: Breast Fed  LATCH Score Latch: Grasps breast easily, tongue down, lips flanged, rhythmical sucking.  Audible Swallowing: A few with stimulation  Type of Nipple: Everted at rest and after stimulation  Comfort (Breast/Nipple): Soft / non-tender  Hold (Positioning): Assistance needed to correctly position infant at breast and maintain latch.  LATCH Score: 8  Interventions Interventions: Breast feeding basics reviewed;Assisted with latch;Skin to skin;Support pillows  Lactation Tools Discussed/Used     Consult Status Consult Status: Follow-up Date: 07/05/18 Follow-up type: In-patient    Dyann KiefMarsha D Annalee Meyerhoff 07/05/2018, 4:13 PM

## 2018-07-05 NOTE — H&P (Signed)
OB History & Physical   History of Present Illness:  Chief Complaint: contractions  HPI:  Robin Wood is a [age] y.o. G2P0010 female at 5610w0d dated by 11 week ultrasound.  Her pregnancy has been complicated by no prenatal care. Also, she had chlamydia and trichomonas that has not been treated until 2 days ago.  She had an episode of emesis about 30 minutes after receiving the medication.  She did get another dose of azithromycin prior to discharge 2 days ago.   She reports painful contractions since last night.   She denies leakage of fluid.   She reports vaginal bleeding.  She has been spotting a little this morning.  She reports fetal movement.    Maternal Medical History:   Past Medical History:  Diagnosis Date  . Anxiety   . Insomnia     Past Surgical History:  Procedure Laterality Date  . NO PAST SURGERIES      Allergies  Allergen Reactions  . Penicillins Hives    Prior to Admission medications   Medication Sig Start Date End Date Taking? Authorizing Provider  Prenatal Vit-Fe Fumarate-FA (PRENATAL MULTIVITAMIN) TABS tablet Take 1 tablet by mouth daily at 12 noon.    [provider]    OB History  Gravida Para Term Preterm AB Living  2 0 0 0 1 0  SAB TAB Ectopic Multiple Live Births               # Outcome Date GA Lbr Len/2nd Weight Sex Delivery Anes PTL Lv  2 Current           1 AB             Prenatal care site: None  Social History: She  reports that she quit smoking about 6 months ago. Her smoking use included cigarettes. She has never used smokeless tobacco. She reports that she drank alcohol. She reports that she does not use drugs.  Family History: She denies a family of gynecologic cancers  Review of Systems: Negative x 10 systems reviewed except as noted in the HPI.    Physical Exam:  Vital Signs: Temp 98.3 F (36.8 C) (Oral)   Ht 5\' 1"  (1.549 m)   Wt 47.6 kg   LMP 09/18/2017   BMI 19.84 kg/m  Constitutional: Well nourished, well  developed female in no acute distress.  HEENT: normal Skin: Warm and dry.  Cardiovascular: Regular rate and rhythm.   Extremity: no edema  Respiratory: Clear to auscultation bilateral. Normal respiratory effort Abdomen: FHT present and gravid, NT Back: no CVAT Neuro: DTRs 2+, Cranial nerves grossly intact Psych: Alert and Oriented x3. No memory deficits. Normal mood and affect.  MS: normal gait, normal bilateral lower extremity ROM/strength/stability.  Pelvic exam: (female chaperone present) Cvx: 2/50/-2  Pertinent Results:  Prenatal Labs: Blood type/Rh O positive  Antibody screen negative  Rubella Unknown  Varicella Unknown    RPR unknown  HBsAg unknown  HIV unknown  GC negative  Chlamydia positive  Genetic screening Did not have due to lack of prenatal care  1 hour GTT Did not have due to lack of prenatal care  3 hour GTT n/a  GBS positive on 07/03/18   Baseline FHR: 135 beats/min   Variability: moderate   Accelerations: present   Decelerations: absent Contractions: present frequency: 1-2 q 10 min Overall assessment: cat 1  Bedside Ultrasound:  Number of Fetus: 1  Presentation: complete breech  Fluid: subjectively normal  Placental Location: anterior -  no previa  Assessment:  Robin Wood is a 22 y.o. G2P0010 female at [redacted]w[redacted]d with labor, vaginal bleeding, breech presentation.   Plan:  1. Admit to Labor & Delivery  2. CBC, T&S, NPO, IVF, prenatal panel 3. GBS positive.   4. Fetwal well-being: reassuring overall 5. Given her gestational age, back-to-back presentations to L&D, no prenatal care, early labor, vaginal spotting, and other risk factors, will proceed to delivery by primary c-section (due to breech). She had previously been counseling regarding attempt at ECV, which she declines.  Will notify OR and move to delivery.    Thomasene Mohair, MD 07/05/2018 9:04 AM

## 2018-07-05 NOTE — Discharge Summary (Signed)
OB Discharge Summary     Patient Name: Robin Wood DOB: [birth year] MRN: [id number]  Date of admission: 07/05/2018 Delivering MD: Thomasene Mohair, MD  Date of Delivery: 07/05/2018  Date of discharge: 07/08/2018  Admitting diagnosis: Malpresentation at 39 weeks Intrauterine pregnancy: [redacted]w[redacted]d     Secondary diagnosis: Anemia     Discharge diagnosis: Term Pregnancy Delivered and Anemia due to iron deficiency                                                                Post partum procedures:none  Augmentation: n/a  Complications: None  Hospital course:  22 y.o. yo G2P0010 at [redacted]w[redacted]d was admitted to the hospital 07/05/2018 for cesarean section with the following indication:Malpresentation.  Membrane Rupture Time/Date: 2:47 PM ,07/05/2018   Patient delivered a Viable infant.07/05/2018  Details of operation can be found in separate operative note.  Pateint had an uncomplicated postpartum course.  She is ambulating, tolerating a regular diet, passing flatus, and urinating well. Patient is discharged home in stable condition on  07/08/18         Physical exam  Vitals:   07/07/18 2030 07/07/18 2333 07/08/18 0350 07/08/18 0807  BP: (!) 127/97 127/78 117/81 124/83  Pulse: 71 85 88 70  Resp: 18 18 18 18   Temp: 98.2 F (36.8 C) 98.5 F (36.9 C) 98.1 F (36.7 C) 98.5 F (36.9 C)  TempSrc: Oral Oral Oral Oral  SpO2: 100% 99% 98% 97%  Weight:      Height:       General: alert, cooperative and no distress Lochia: appropriate Uterine Fundus: firm Incision: Healing well with no significant drainage, No significant erythema, Dressing is clean, dry, and intact DVT Evaluation: No evidence of DVT seen on physical exam.  Labs: Lab Results  Component Value Date   WBC 8.0 07/07/2018   HGB 7.4 (L) 07/07/2018   HCT 22.5 (L) 07/07/2018   MCV 73.2 (L) 07/07/2018   PLT 227 07/07/2018    Discharge instruction: per After Visit Summary.  Medications:  Allergies as of 07/08/2018       Reactions   Penicillins Hives      Medication List    TAKE these medications   ferrous sulfate 325 (65 FE) MG tablet Take 1 tablet (325 mg total) by mouth 2 (two) times daily with a meal.   ibuprofen 600 MG tablet Commonly known as:  ADVIL,MOTRIN Take 1 tablet (600 mg total) by mouth every 6 (six) hours.   medroxyPROGESTERone 150 MG/ML injection Commonly known as:  DEPO-PROVERA Inject 1 mL (150 mg total) into the muscle every 3 (three) months.   NIFEdipine 30 MG 24 hr tablet Commonly known as:  PROCARDIA-XL/ADALAT CC Take 1 tablet (30 mg total) by mouth daily.   oxyCODONE-acetaminophen 5-325 MG tablet Commonly known as:  PERCOCET/ROXICET Take 1 tablet by mouth every 4 (four) hours as needed for moderate pain (pain score 4-7/10).   prenatal multivitamin Tabs tablet Take 1 tablet by mouth daily at 12 noon.            Discharge Care Instructions  (From admission, onward)         Start     Ordered   07/08/18 0000  Discharge wound care:    Comments:  [facility] DAILY Wash  incision gently with soap and water.  Call office with any drainage, redness, or firmness of the incision.   07/08/18 0858          Diet: routine diet  Activity: Advance as tolerated. Pelvic rest for 6 weeks.   Outpatient follow up: Follow-up Information    Conard NovakJackson, Stephen D, MD In 1 week.   Specialty:  Obstetrics and Gynecology Why:  incision check Contact information: [address] [place] [address] [phone]             Postpartum contraception: Depo Provera Rhogam Given postpartum: no Rubella vaccine given postpartum: no Varicella vaccine given postpartum: no TDaP given antepartum or postpartum: yes  Newborn Data: Live born female  Birth Weight: 6 lb 12.6 oz (3080 g) APGAR: 8, 9  Newborn Delivery   Birth date/time:  07/05/2018 14:49:00 Delivery type:  C-Section, Low Transverse Trial of labor:  No C-section categorization:  Primary      Baby Feeding:  Breast  Disposition:home with mother  SIGNED: Adelene Idlerhristanna Havard Radigan MD [facility], [facility] 07/08/18 9:00 AM

## 2018-07-06 ENCOUNTER — Encounter: Payer: Self-pay | Admitting: Obstetrics and Gynecology

## 2018-07-06 LAB — CBC
HCT: 26.3 % — ABNORMAL LOW (ref 35.0–47.0)
HEMOGLOBIN: 8.7 g/dL — AB (ref 12.0–16.0)
MCH: 24.4 pg — ABNORMAL LOW (ref 26.0–34.0)
MCHC: 33 g/dL (ref 32.0–36.0)
MCV: 73.8 fL — ABNORMAL LOW (ref 80.0–100.0)
PLATELETS: 224 10*3/uL (ref 150–440)
RBC: 3.56 MIL/uL — ABNORMAL LOW (ref 3.80–5.20)
RDW: 16.5 % — AB (ref 11.5–14.5)
WBC: 8.1 10*3/uL (ref 3.6–11.0)

## 2018-07-06 LAB — RPR: RPR Ser Ql: NONREACTIVE

## 2018-07-06 LAB — RUBELLA SCREEN: Rubella: 2.35 index (ref >0.99)

## 2018-07-06 MED ORDER — ACETAMINOPHEN 500 MG PO TABS
1000.0000 mg | ORAL_TABLET | Freq: Four times a day (QID) | ORAL | Status: AC
  Administered 2018-07-06 (×2): 1000 mg via ORAL
  Filled 2018-07-06 (×2): qty 2

## 2018-07-06 MED ORDER — IBUPROFEN 600 MG PO TABS
600.0000 mg | ORAL_TABLET | Freq: Four times a day (QID) | ORAL | Status: DC
  Administered 2018-07-06 – 2018-07-08 (×6): 600 mg via ORAL
  Filled 2018-07-06 (×6): qty 1

## 2018-07-06 MED ORDER — METRONIDAZOLE 500 MG PO TABS
1000.0000 mg | ORAL_TABLET | ORAL | Status: AC
  Administered 2018-07-06 (×2): 1000 mg via ORAL
  Filled 2018-07-06 (×2): qty 2

## 2018-07-06 NOTE — Plan of Care (Signed)
Alert and active. Apropriate affect. Demonstrating aprop. Care of Infnat and self. Denies c/o.

## 2018-07-06 NOTE — Plan of Care (Signed)
VS stable; moves well in bed; taking tylenol for pain control; will have clear liquids for breakfast and if she tolerates it, pt can have regular diet; out of bed with assistance for now

## 2018-07-06 NOTE — Progress Notes (Addendum)
Obstetric Postpartum/PostOperative Daily Progress Note Subjective:  22 y.o. G2P1011 status post primary cesarean delivery for breech presentation.  She is ambulating, is tolerating po, is voiding spontaneously.  Her pain is well controlled on PO pain medications. Her lochia is less than menses.   Medications SCHEDULED MEDICATIONS  . acetaminophen  1,000 mg Oral Q6H  . ferrous sulfate  325 mg Oral BID WC  . ibuprofen  600 mg Oral Q6H  . metroNIDAZOLE  1,000 mg Oral Q1H  . prenatal multivitamin  1 tablet Oral Q1200  . senna-docusate  2 tablet Oral Q24H  . simethicone  80 mg Oral TID PC    MEDICATION INFUSIONS  . lactated ringers      PRN MEDICATIONS  coconut oil, witch hazel-glycerin **AND** dibucaine, diphenhydrAMINE **OR** diphenhydrAMINE, diphenhydrAMINE, ketorolac **OR** ketorolac, menthol-cetylpyridinium, meperidine (DEMEROL) injection, nalbuphine **OR** nalbuphine, naloxone **AND** sodium chloride flush, ondansetron (ZOFRAN) IV, oxyCODONE-acetaminophen **OR** oxyCODONE-acetaminophen    Objective:   Vitals:   07/05/18 2120 07/06/18 0009 07/06/18 0404 07/06/18 0838  BP: (!) 145/84 (!) 147/89 125/83 116/89  Pulse: 65 67 60 76  Resp: 20 20 18 18   Temp: 98.5 F (36.9 C) 98.4 F (36.9 C) 98 F (36.7 C) 99.1 F (37.3 C)  TempSrc: Oral Oral Oral Oral  SpO2: 95% 96% 99% 100%  Weight:      Height:        Current Vital Signs 24h Vital Sign Ranges  T 99.1 F (37.3 C) Temp  Avg: 98.3 F (36.8 C)  Min: 97.4 F (36.3 C)  Max: 99.1 F (37.3 C)  BP 116/89 BP  Min: 114/82  Max: 147/89  HR 76 Pulse  Avg: 64.1  Min: 27  Max: 98  RR 18 Resp  Avg: 16.7  Min: 8  Max: 25  SaO2 100 % Room Air SpO2  Avg: 99.1 %  Min: 95 %  Max: 100 %       24 Hour I/O Current Shift I/O  Time Ins Outs 08/10 0701 - 08/11 0700 In: 3469.4 [I.V.:2419.4] Out: 2650 [Urine:1950] 08/11 0701 - 08/11 1900 In: 240 [P.O.:240] Out: -   General: NAD Pulmonary: no increased work of breathing Abdomen:  non-distended, non-tender, fundus firm at level of umbilicus Inc: Clean/dry/intact Extremities: no edema, no erythema, no tenderness  Labs:  Recent Labs  Lab 07/05/18 0942 07/06/18 0702  WBC 6.8 8.1  HGB 8.8* 8.7*  HCT 27.3* 26.3*  PLT 245 224     Assessment:   21 y.o. G2P1011 postoperative day # 1, s/p primary cesarean section  Plan:  1) Acute blood loss anemia - hemodynamically stable and asymptomatic - po ferrous sulfate  2) O POS / Rubella unknown (pending)    3) TDAP status: has not received (no prenatal care this pregnancy)  4) breast /Contraception = Depo-Provera - injection prior to discharge  5) chlamydia: treated on 8/8  6) trichomonas vaginalis: attempted treatment on 8/8, but had an episode of emesis 30 minutes after receiving medication. Will attempt to treat her again today with two doses of 1,000 mg each 1 hour apart with food.   7) Disposition: home POD#2-3  Conard NovakStephen D. Kazzandra Desaulniers, MD, [facility] 07/06/2018 12:27 PM

## 2018-07-06 NOTE — Lactation Note (Signed)
This note was copied from a baby's chart. Lactation Consultation Note  Patient Name: Robin Wood Today's Date: 07/06/2018  Mom unable to wake Robin Wood on her own.  Demonstrated waking techniques and hand expression.  Once we got her awake, she began strong rhythmic sucking teaching mom how to listen for swallows for 10 to 15 minutes.  Mom denies any breast/nipple pain during or after breast feed.  Explained supply and demand, routine newborn feeding patterns and normal course of lactation.  Lactation name and number written on white board and encouraged to call with any questions, concerns or assistance.   Maternal Data Formula Feeding for Exclusion: No Has patient been taught Hand Expression?: Yes Does the patient have breastfeeding experience prior to this delivery?: No  Feeding Feeding Type: Breast Fed Length of feed: 15 min  LATCH Score                   Interventions    Lactation Tools Discussed/Used     Consult Status      Robin Wood, Robin Wood 07/06/2018, 9:24 PM

## 2018-07-06 NOTE — Anesthesia Postprocedure Evaluation (Signed)
Anesthesia Post Note  Patient: Robin Wood  Procedure(s) Performed: CESAREAN SECTION (N/A )  Patient location during evaluation: PACU Anesthesia Type: Spinal Level of consciousness: oriented and awake and alert Pain management: pain level controlled Vital Signs Assessment: post-procedure vital signs reviewed and stable Respiratory status: spontaneous breathing, respiratory function stable and patient connected to nasal cannula oxygen Cardiovascular status: blood pressure returned to baseline and stable Postop Assessment: no headache, no backache and no apparent nausea or vomiting Anesthetic complications: no     Last Vitals:  Vitals:   07/06/18 0404 07/06/18 0838  BP: 125/83 116/89  Pulse: 60 76  Resp: 18 18  Temp: 36.7 C 37.3 C  SpO2: 99% 100%    Last Pain:  Vitals:   07/06/18 0840  TempSrc:   PainSc: 7                  [name] S

## 2018-07-07 ENCOUNTER — Encounter: Payer: Self-pay | Admitting: Certified Nurse Midwife

## 2018-07-07 LAB — COMPREHENSIVE METABOLIC PANEL
ALBUMIN: 2.4 g/dL — AB (ref 3.5–5.0)
ALK PHOS: 94 U/L (ref 38–126)
ALT: 11 U/L (ref 0–44)
AST: 26 U/L (ref 15–41)
Anion gap: 6 (ref 5–15)
BUN: 10 mg/dL (ref 6–20)
CALCIUM: 8.4 mg/dL — AB (ref 8.9–10.3)
CHLORIDE: 106 mmol/L (ref 98–111)
CO2: 27 mmol/L (ref 22–32)
CREATININE: 0.77 mg/dL (ref 0.44–1.00)
GFR calc non Af Amer: >60 mL/min (ref >60)
GLUCOSE: 92 mg/dL (ref 70–99)
Potassium: 3.7 mmol/L (ref 3.5–5.1)
SODIUM: 139 mmol/L (ref 135–145)
Total Bilirubin: 0.3 mg/dL (ref 0.3–1.2)
Total Protein: 6.2 g/dL — ABNORMAL LOW (ref 6.5–8.1)

## 2018-07-07 LAB — PROTEIN / CREATININE RATIO, URINE
CREATININE, URINE: 31 mg/dL
Protein Creatinine Ratio: 0.29 mg/mg{Cre} — ABNORMAL HIGH (ref 0.00–0.15)
Total Protein, Urine: 9 mg/dL

## 2018-07-07 LAB — CBC
HCT: 22.5 % — ABNORMAL LOW (ref 35.0–47.0)
Hemoglobin: 7.4 g/dL — ABNORMAL LOW (ref 12.0–16.0)
MCH: 23.9 pg — AB (ref 26.0–34.0)
MCHC: 32.7 g/dL (ref 32.0–36.0)
MCV: 73.2 fL — AB (ref 80.0–100.0)
Platelets: 227 10*3/uL (ref 150–440)
RBC: 3.08 MIL/uL — AB (ref 3.80–5.20)
RDW: 16.5 % — ABNORMAL HIGH (ref 11.5–14.5)
WBC: 8 10*3/uL (ref 3.6–11.0)

## 2018-07-07 MED ORDER — NIFEDIPINE ER OSMOTIC RELEASE 30 MG PO TB24
30.0000 mg | ORAL_TABLET | Freq: Every day | ORAL | Status: DC
  Administered 2018-07-07 – 2018-07-08 (×2): 30 mg via ORAL
  Filled 2018-07-07 (×2): qty 1

## 2018-07-07 NOTE — Clinical Social Work Maternal (Signed)
  CLINICAL SOCIAL WORK MATERNAL/CHILD NOTE  Patient Details  Name: Robin Wood MRN: [id number] Date of Birth: 08/18/1996  Date:  07/07/2018  Clinical Social Worker Initiating Note:  York SpanielMonica Shaneca Orne MSW,LCSW  Date/Time: Initiated:  07/07/18/      Child's Name:      Biological Parents:  Mother   Need for Interpreter:  None   Reason for Referral:  Late or No Prenatal Care    Address:  [address] [place] [address]    Phone number:  [phone] (home)     Additional phone number: none  Household Members/Support Persons (HM/SP):       HM/SP Name Relationship DOB or Age  HM/SP -1        HM/SP -2        HM/SP -3        HM/SP -4        HM/SP -5        HM/SP -6        HM/SP -7        HM/SP -8          Natural Supports (not living in the home):  Friends   Professional Supports: None   Employment: Unemployed   Type of Work:     Education:      Homebound arranged: No  Financial Resources:  Medicaid   Other Resources:  English as a second language teacherood Stamps    Cultural/Religious Considerations Which May Impact Care:    Strengths:  Ability to meet basic needs , Compliance with medical plan , Home prepared for child    Psychotropic Medications:         Pediatrician:       [place]:   [place]    [place]    [place]    [place] [facility]    [place]    [place]      Pediatrician Fax Number:    Risk Factors/Current Problems:  None   Cognitive State:  Alert , Able to Concentrate    Mood/Affect:  Calm    CSW Assessment: CSW consulted for no prenatal care. CSW spoke with patient who was somewhat sleepy but able to communicate clearly. Patient informed CSW that she found out she was pregnant when she came for an ED visit and found she was [redacted] weeks pregnant. She stated that she began prenatal care when her medicaid was instated and that [place] of baby at the time and received 5-6 months of prenatal care.  Patient states she will be living with her mother and that she has supportive family. She states that father of baby will not be involved and that she is no longer involved with father of baby. CSW inquired if there were any safety concerns and she stated that there were not. She has all necessities for her newborn. She has no history of mental illness. CSW did provide education surrounding postpartum depression. Patient had no concerns or questions at this time.   CSW Plan/Description:  No Further Intervention Required/No Barriers to Discharge    York SpanielMonica Maurion [place], LCSW 07/07/2018, 11:19 AM

## 2018-07-07 NOTE — Lactation Note (Signed)
This note was copied from a baby's chart. Lactation Consultation Note  Patient Name: Girl Charlestine NightFrances Mcguirk Today's Date: 07/07/2018  Assisted mom with waking Nai'aoah to get on breast.  She opens mouth wide, but originally only wants to get on tip of nipple.  Encouraged mom to hold him closer to the breast with nose and chin touching breast.  Mom not very aggressive.  Demonstrated how to massage breast and stimulate Nai'aoah to keep him nutritively sucking at the breast.     Maternal Data    Feeding    LATCH Score                   Interventions    Lactation Tools Discussed/Used     Consult Status      Louis MeckelWilliams, Kenisha Lynds Kay 07/07/2018, 4:21 PM

## 2018-07-07 NOTE — Progress Notes (Signed)
POD #2 LTCS for breech presentation Subjective:  Feeling a little dizzy after taking a Percocet for pain. Bleeding slowing. Tolerating a regular diet and passing flatus. Voiding without difficulty. Breast feeding. PArtner has not been treated for Chlamydia or Trich-patient will not be seeing again.   Objective:  Blood pressure 129/89, pulse 71, temperature 98.4 F (36.9 C), temperature source Oral, resp. rate 18, height 5\' 1"  (1.549 m), weight 47.6 kg, last menstrual period 09/18/2017, SpO2 99 %, unknown if currently breastfeeding. Having occasional mild range blood pressures: Temp:  [98 F (36.7 C)-98.5 F (36.9 C)] 98.4 F (36.9 C) (08/12 0800) Pulse Rate:  [62-71] 71 (08/12 0800) Resp:  [18] 18 (08/12 0800) BP: (129-148)/(86-99) 129/89 (08/12 0843) SpO2:  [99 %] 99 % (08/12 0800) General: BF in NAD Heart: RRR with Grade II/VI systolic murmur best heard at aortic and pulmonic areas Pulmonary: no increased work of breathing/ CTAB Abdomen: non-distended, non-tender, fundus firm at level of umbilicus-1FB Incision: C+D+I Lochia: appropriate Extremities: no edema, no erythema, no tenderness  Results for orders placed or performed during the hospital encounter of 07/05/18 (from the past 72 hour(s))  Type and screen [facility]     Status: None   Collection Time: 07/05/18  9:40 AM  Result Value Ref Range   ABO/RH(D) O POS    Antibody Screen NEG    Sample Expiration      07/08/2018 Performed at [facility], [address] [place], [place] [address]   CBC     Status: Abnormal   Collection Time: 07/05/18  9:42 AM  Result Value Ref Range   WBC 6.8 3.6 - 11.0 K/uL   RBC 3.68 (L) 3.80 - 5.20 MIL/uL   Hemoglobin 8.8 (L) 12.0 - 16.0 g/dL   HCT 60.427.3 (L) 54.035.0 - 98.147.0 %   MCV 74.2 (L) 80.0 - 100.0 fL   MCH 24.0 (L) 26.0 - 34.0 pg   MCHC 32.3 32.0 - 36.0 g/dL   RDW 19.116.6 (H) 47.811.5 - 29.514.5 %   Platelets 245 150 - 440 K/uL    Comment: Performed at [facility], [address] [place], [place] [address]  RPR     Status: None   Collection Time: 07/05/18  9:42 AM  Result Value Ref Range   RPR Ser Ql Non Reactive Non Reactive    Comment: (NOTE) Performed At: [facility] [address] [place], [place] [id number] Jolene SchimkeNagendra Sanjai MD EX:[id number]:[phone]   Rapid HIV screen (HIV 1/2 Ab+Ag)     Status: None   Collection Time: 07/05/18  9:42 AM  Result Value Ref Range   HIV-1 P24 Antigen - HIV24 NON REACTIVE NON REACTIVE   HIV 1/2 Antibodies NON REACTIVE NON REACTIVE   Interpretation (HIV Ag Ab)      A non reactive test result means that HIV 1 or HIV 2 antibodies and HIV 1 p24 antigen were not detected in the specimen.    Comment: Performed at [facility], [address] [place], [place] [address]  Rubella screen     Status: None   Collection Time: 07/05/18  9:42 AM  Result Value Ref Range   Rubella 2.35 Immune >0.99 index    Comment: (NOTE)                                Non-immune       <0.90  Equivocal  0.90 - 0.99                                Immune           >0.99 Performed At: [facility] [address] [place], [place] [id number] Jolene Schimke MD WU:[id number]   Urine Drug Screen, Qualitative [facility] only)     Status: Abnormal   Collection Time: 07/05/18  9:42 AM  Result Value Ref Range   Tricyclic, Ur Screen NONE DETECTED NONE DETECTED   Amphetamines, Ur Screen NONE DETECTED NONE DETECTED   MDMA (Ecstasy)Ur Screen NONE DETECTED NONE DETECTED   Cocaine Metabolite,Ur [place] NONE DETECTED NONE DETECTED   Opiate, Ur Screen NONE DETECTED NONE DETECTED   Phencyclidine (PCP) Ur S NONE DETECTED NONE DETECTED   Cannabinoid 50 Ng, Ur [place] NONE DETECTED NONE DETECTED   Barbiturates, Ur Screen NONE DETECTED NONE DETECTED   Benzodiazepine, Ur Scrn TEST NOT PERFORMED, REAGENT NOT AVAILABLE (A) NONE DETECTED   Methadone Scn, Ur NONE DETECTED NONE DETECTED    Comment: (NOTE) Tricyclics  + metabolites, urine    Cutoff 1000 ng/mL Amphetamines + metabolites, urine  Cutoff 1000 ng/mL MDMA (Ecstasy), urine              Cutoff 500 ng/mL Cocaine Metabolite, urine          Cutoff 300 ng/mL Opiate + metabolites, urine        Cutoff 300 ng/mL Phencyclidine (PCP), urine         Cutoff 25 ng/mL Cannabinoid, urine                 Cutoff 50 ng/mL Barbiturates + metabolites, urine  Cutoff 200 ng/mL Benzodiazepine, urine              Cutoff 200 ng/mL Methadone, urine                   Cutoff 300 ng/mL The urine drug screen provides only a preliminary, unconfirmed analytical test result and should not be used for non-medical purposes. Clinical consideration and professional judgment should be applied to any positive drug screen result due to possible interfering substances. A more specific alternate chemical method must be used in order to obtain a confirmed analytical result. Gas chromatography / mass spectrometry (GC/MS) is the preferred confirmat ory method. Performed at [facility], [address] [place], [place] [address]   Chlamydia/NGC rt PCR [facility] only)     Status: Abnormal   Collection Time: 07/05/18  9:42 AM  Result Value Ref Range   Specimen source GC/Chlam URINE, RANDOM    Chlamydia Tr DETECTED (A) NOT DETECTED   N gonorrhoeae NOT DETECTED NOT DETECTED    Comment: (NOTE) This CT/NG assay has not been evaluated in patients with a history of  hysterectomy. Performed at [facility], [address] [place], [place] [address]   CBC     Status: Abnormal   Collection Time: 07/06/18  7:02 AM  Result Value Ref Range   WBC 8.1 3.6 - 11.0 K/uL   RBC 3.56 (L) 3.80 - 5.20 MIL/uL   Hemoglobin 8.7 (L) 12.0 - 16.0 g/dL   HCT 78.4 (L) 69.6 - 29.5 %   MCV 73.8 (L) 80.0 - 100.0 fL   MCH 24.4 (L) 26.0 - 34.0 pg   MCHC 33.0 32.0 - 36.0 g/dL   RDW 28.4 (H) 13.2 - 44.0 %  Platelets 224 150 - 440 K/uL    Comment: Performed at [facility], [address] [place], [place] [address]     Assessment:   22 y.o. [phone] postoperativeday # 2-stable  Occasional mild blood pressure elevations-will get CMP and repeat CBC and continue to monitor blood pressures.  Ambulate with assist if needed.   Plan:  1) Acute blood loss anemia - hemodynamically stable and asymptomatic (lightheaded after narcotic-reminded to ask for help when OOB when feeling lightheaded) - po ferrous sulfate and vitamins  2)O POS/ Rubella Immune/ RPR neg/ HIV neg/ HBsAG pending  3) TDAP -give on discharge  4) Breast/Depo  5) Disposition-probably tomorrow

## 2018-07-08 LAB — HEPATITIS B SURFACE ANTIGEN: Hepatitis B Surface Ag: NEGATIVE

## 2018-07-08 MED ORDER — IBUPROFEN 600 MG PO TABS: 600.0000 mg | ORAL_TABLET | Freq: Four times a day (QID) | ORAL | 3 refills | Status: DC

## 2018-07-08 MED ORDER — TETANUS-DIPHTH-ACELL PERTUSSIS 5-2.5-18.5 LF-MCG/0.5 IM SUSP
0.5000 mL | Freq: Once | INTRAMUSCULAR | Status: AC
  Administered 2018-07-08: 0.5 mL via INTRAMUSCULAR
  Filled 2018-07-08: qty 0.5

## 2018-07-08 MED ORDER — MEDROXYPROGESTERONE ACETATE 150 MG/ML IM SUSP
150.0000 mg | Freq: Once | INTRAMUSCULAR | Status: AC
  Administered 2018-07-08: 150 mg via INTRAMUSCULAR
  Filled 2018-07-08: qty 1

## 2018-07-08 MED ORDER — MEDROXYPROGESTERONE ACETATE 150 MG/ML IM SUSP: 150.0000 mg | INTRAMUSCULAR | 3 refills | Status: AC

## 2018-07-08 MED ORDER — NIFEDIPINE ER 30 MG PO TB24: 30.0000 mg | ORAL_TABLET | Freq: Every day | ORAL | 1 refills | Status: DC

## 2018-07-08 MED ORDER — FERROUS SULFATE 325 (65 FE) MG PO TABS: 325.0000 mg | ORAL_TABLET | Freq: Two times a day (BID) | ORAL | 11 refills | Status: DC

## 2018-07-08 MED ORDER — OXYCODONE-ACETAMINOPHEN 5-325 MG PO TABS: 1.0000 | ORAL_TABLET | ORAL | 0 refills | Status: DC | PRN

## 2018-07-08 NOTE — Progress Notes (Addendum)
Patient ID: Robin Wood, female   DOB: 10/12/1996, 22 y.o.   MRN: [id number]  Admit Date: 07/05/2018 Today's Date: 07/08/2018  Subjective: Postpartum Day 3: Cesarean Delivery Patient reports tolerating PO, + flatus, + BM and no problems voiding.    Objective: Vital signs in last 24 hours: Temp:  [98.1 F (36.7 C)-98.7 F (37.1 C)] 98.5 F (36.9 C) (08/13 0807) Pulse Rate:  [64-88] 70 (08/13 0807) Resp:  [18] 18 (08/13 0807) BP: (117-157)/(78-97) 124/83 (08/13 0807) SpO2:  [97 %-100 %] 97 % (08/13 0807)  Physical Exam:  General: alert and cooperative Lochia: appropriate Uterine Fundus: firm Incision: healing well, no significant drainage, no dehiscence, no significant erythema DVT Evaluation: No evidence of DVT seen on physical exam.  Recent Labs    07/06/18 0702 07/07/18 0953  HGB 8.7* 7.4*  HCT 26.3* 22.5*    Assessment/Plan: Status post Cesarean section. Doing well postoperatively.  Discharge home with standard precautions and return to clinic in 1 week.  1) Acute blood loss anemia - hemodynamically stable and asymptomatic - po ferrous sulfate and vitamins  2) Post partum preeclampsia with mild range blood pressures. Discussed risks of stroke and death with patient. Advised here that it was very important that she take her blood pressure medication when she is discharged home. Will follow up in 1 week in the clinic for a blood pressure check. She does not have a headache, vision changes or right upper quadrant pain today. Asked her to come to the ER if she develops any of these symptoms.   2)O POS/ Rubella Immune/ RPR neg/ HIV neg/ HBsAG pending  3) TDAP -give on discharge today  4) Breast/Depo. Will give Depo before discharge.  5) Disposition-home today.   Christanna R Schuman 07/08/2018, 8:43 AM

## 2018-07-08 NOTE — Lactation Note (Signed)
This note was copied from a baby's chart. Lactation Consultation Note  Patient Name: Robin Wood KPQAE'[id number] Date: 07/08/2018 Reason for consult: Follow-up assessment   Mom ready to go home (bags packed and waiting on ride). She claims baby is breastfeeding better and her breasts are fuller today. I reviewed prevention of engorgement and gave her a breast pump kit to take home for return to work later on. She states she does not have [facility] yet, but had their # to schedule appointment. She denied having any other questions or needs.    Maternal Data    Feeding Feeding Type: Breast Fed  LATCH Score                   Interventions Interventions: Hand pump  Lactation Tools Discussed/Used WIC Program: No(she said she does not have it yet; has phone#) Pump Review: Setup, frequency, and cleaning;Milk Storage Initiated by:: Tammi Sou Date initiated:: 07/08/18   Consult Status Consult Status: PRN(has [place] contact info)    Roque Cash 07/08/2018, 11:18 AM

## 2018-07-08 NOTE — Discharge Instructions (Signed)
Breastfeeding Choosing to breastfeed is one of the best decisions you can make for yourself and your baby. A change in hormones during pregnancy causes your breasts to make breast milk in your milk-producing glands. Hormones prevent breast milk from being released before your baby is born. They also prompt milk flow after birth. Once breastfeeding has begun, thoughts of your baby, as well as his or her sucking or crying, can stimulate the release of milk from your milk-producing glands. Benefits of breastfeeding Research shows that breastfeeding offers many health benefits for infants and mothers. It also offers a cost-free and convenient way to feed your baby. For your baby  Your first milk (colostrum) helps your baby's digestive system to function better.  Special cells in your milk (antibodies) help your baby to fight off infections.  Breastfed babies are less likely to develop asthma, allergies, obesity, or type 2 diabetes. They are also at lower risk for sudden infant death syndrome (SIDS).  Nutrients in breast milk are better able to meet your babys needs compared to infant formula.  Breast milk improves your baby's brain development. For you  Breastfeeding helps to create a very special bond between you and your baby.  Breastfeeding is convenient. Breast milk costs nothing and is always available at the correct temperature.  Breastfeeding helps to burn calories. It helps you to lose the weight that you gained during pregnancy.  Breastfeeding makes your uterus return faster to its size before pregnancy. It also slows bleeding (lochia) after you give birth.  Breastfeeding helps to lower your risk of developing type 2 diabetes, osteoporosis, rheumatoid arthritis, cardiovascular disease, and breast, ovarian, uterine, and endometrial cancer later in life. Breastfeeding basics Starting breastfeeding  Find a comfortable place to sit or lie down, with your neck and back  well-supported.  Place a pillow or a rolled-up blanket under your baby to bring him or her to the level of your breast (if you are seated). Nursing pillows are specially designed to help support your arms and your baby while you breastfeed.  Make sure that your baby's tummy (abdomen) is facing your abdomen.  Gently massage your breast. With your fingertips, massage from the outer edges of your breast inward toward the nipple. This encourages milk flow. If your milk flows slowly, you may need to continue this action during the feeding.  Support your breast with 4 fingers underneath and your thumb above your nipple (make the letter "C" with your hand). Make sure your fingers are well away from your nipple and your babys mouth.  Stroke your baby's lips gently with your finger or nipple.  When your baby's mouth is open wide enough, quickly bring your baby to your breast, placing your entire nipple and as much of the areola as possible into your baby's mouth. The areola is the colored area around your nipple. ? More areola should be visible above your baby's upper lip than below the lower lip. ? Your baby's lips should be opened and extended outward (flanged) to ensure an adequate, comfortable latch. ? Your baby's tongue should be between his or her lower gum and your breast.  Make sure that your baby's mouth is correctly positioned around your nipple (latched). Your baby's lips should create a seal on your breast and be turned out (everted).  It is common for your baby to suck about 2-3 minutes in order to start the flow of breast milk. Latching Teaching your baby how to latch onto your breast properly is very  important. An improper latch can cause nipple pain, decreased milk supply, and poor weight gain in your baby. Also, if your baby is not latched onto your nipple properly, he or she may swallow some air during feeding. This can make your baby fussy. Burping your baby when you switch breasts  during the feeding can help to get rid of the air. However, teaching your baby to latch on properly is still the best way to prevent fussiness from swallowing air while breastfeeding. °Signs that your baby has successfully latched onto your nipple °· Silent tugging or silent sucking, without causing you pain. Infant's lips should be extended outward (flanged). °· Swallowing heard between every 3-4 sucks once your milk has started to flow (after your let-down milk reflex occurs). °· Muscle movement above and in front of his or her ears while sucking. ° °Signs that your baby has not successfully latched onto your nipple °· Sucking sounds or smacking sounds from your baby while breastfeeding. °· Nipple pain. ° °If you think your baby has not latched on correctly, slip your finger into the corner of your baby’s mouth to break the suction and place it between your baby's gums. Attempt to start breastfeeding again. °Signs of successful breastfeeding °Signs from your baby °· Your baby will gradually decrease the number of sucks or will completely stop sucking. °· Your baby will fall asleep. °· Your baby's body will relax. °· Your baby will retain a small amount of milk in his or her mouth. °· Your baby will let go of your breast by himself or herself. ° °Signs from you °· Breasts that have increased in firmness, weight, and size 1-3 hours after feeding. °· Breasts that are softer immediately after breastfeeding. °· Increased milk volume, as well as a change in milk consistency and color by the fifth day of breastfeeding. °· Nipples that are not sore, cracked, or bleeding. ° °Signs that your baby is getting enough milk °· Wetting at least 1-2 diapers during the first 24 hours after birth. °· Wetting at least 5-6 diapers every 24 hours for the first week after birth. The urine should be clear or pale yellow by the age of 5 days. °· Wetting 6-8 diapers every 24 hours as your baby continues to grow and develop. °· At least 3  stools in a 24-hour period by the age of 5 days. The stool should be soft and yellow. °· At least 3 stools in a 24-hour period by the age of 7 days. The stool should be seedy and yellow. °· No loss of weight greater than 10% of birth weight during the first 3 days of life. °· Average weight gain of 4-7 oz (113-198 g) per week after the age of 4 days. °· Consistent daily weight gain by the age of 5 days, without weight loss after the age of 2 weeks. °After a feeding, your baby may spit up a small amount of milk. This is normal. °Breastfeeding frequency and duration °Frequent feeding will help you make more milk and can prevent sore nipples and extremely full breasts (breast engorgement). Breastfeed when you feel the need to reduce the fullness of your breasts or when your baby shows signs of hunger. This is called "breastfeeding on demand." Signs that your baby is hungry include: °· Increased alertness, activity, or restlessness. °· Movement of the head from side to side. °· Opening of the mouth when the corner of the mouth or cheek is stroked (rooting). °· Increased sucking sounds,   smacking lips, cooing, sighing, or squeaking. °· Hand-to-mouth movements and sucking on fingers or hands. °· Fussing or crying. ° °Avoid introducing a pacifier to your baby in the first 4-6 weeks after your baby is born. After this time, you may choose to use a pacifier. Research has shown that pacifier use during the first year of a baby's life decreases the risk of sudden infant death syndrome (SIDS). °Allow your baby to feed on each breast as long as he or she wants. When your baby unlatches or falls asleep while feeding from the first breast, offer the second breast. Because newborns are often sleepy in the first few weeks of life, you may need to awaken your baby to get him or her to feed. °Breastfeeding times will vary from baby to baby. However, the following rules can serve as a guide to help you make sure that your baby is  properly fed: °· Newborns (babies 4 weeks of age or younger) may breastfeed every 1-3 hours. °· Newborns should not go without breastfeeding for longer than 3 hours during the day or 5 hours during the night. °· You should breastfeed your baby a minimum of 8 times in a 24-hour period. ° °Breast milk pumping °Pumping and storing breast milk allows you to make sure that your baby is exclusively fed your breast milk, even at times when you are unable to breastfeed. This is especially important if you go back to work while you are still breastfeeding, or if you are not able to be present during feedings. Your lactation consultant can help you find a method of pumping that works best for you and give you guidelines about how long it is safe to store breast milk. °Caring for your breasts while you breastfeed °Nipples can become dry, cracked, and sore while breastfeeding. The following recommendations can help keep your breasts moisturized and healthy: °· Avoid using soap on your nipples. °· Wear a supportive bra designed especially for nursing. Avoid wearing underwire-style bras or extremely tight bras (sports bras). °· Air-dry your nipples for 3-4 minutes after each feeding. °· Use only cotton bra pads to absorb leaked breast milk. Leaking of breast milk between feedings is normal. °· Use lanolin on your nipples after breastfeeding. Lanolin helps to maintain your skin's normal moisture barrier. Pure lanolin is not harmful (not toxic) to your baby. You may also hand express a few drops of breast milk and gently massage that milk into your nipples and allow the milk to air-dry. ° °In the first few weeks after giving birth, some women experience breast engorgement. Engorgement can make your breasts feel heavy, warm, and tender to the touch. Engorgement peaks within 3-5 days after you give birth. The following recommendations can help to ease engorgement: °· Completely empty your breasts while breastfeeding or pumping. You  may want to start by applying warm, moist heat (in the shower or with warm, water-soaked hand towels) just before feeding or pumping. This increases circulation and helps the milk flow. If your baby does not completely empty your breasts while breastfeeding, pump any extra milk after he or she is finished. °· Apply ice packs to your breasts immediately after breastfeeding or pumping, unless this is too uncomfortable for you. To do this: °? Put ice in a plastic bag. °? Place a towel between your skin and the bag. °? Leave the ice on for 20 minutes, 2-3 times a day. °· Make sure that your baby is latched on and positioned properly while   breastfeeding.  If engorgement persists after 48 hours of following these recommendations, contact your health care provider or a Advertising copywriterlactation consultant. Overall health care recommendations while breastfeeding  Eat 3 healthy meals and 3 snacks every day. Well-nourished mothers who are breastfeeding need an additional 450-500 calories a day. You can meet this requirement by increasing the amount of a balanced diet that you eat.  Drink enough water to keep your urine pale yellow or clear.  Rest often, relax, and continue to take your prenatal vitamins to prevent fatigue, stress, and low vitamin and mineral levels in your body (nutrient deficiencies).  Do not use any products that contain nicotine or tobacco, such as cigarettes and e-cigarettes. Your baby may be harmed by chemicals from cigarettes that pass into breast milk and exposure to secondhand smoke. If you need help quitting, ask your health care provider.  Avoid alcohol.  Do not use illegal drugs or marijuana.  Talk with your health care provider before taking any medicines. These include over-the-counter and prescription medicines as well as vitamins and herbal supplements. Some medicines that may be harmful to your baby can pass through breast milk.  It is possible to become pregnant while breastfeeding. If  birth control is desired, ask your health care provider about options that will be safe while breastfeeding your baby. Where to find more information: [employer]: www.llli.org Contact a health care provider if:  You feel like you want to stop breastfeeding or have become frustrated with breastfeeding.  Your nipples are cracked or bleeding.  Your breasts are red, tender, or warm.  You have: ? Painful breasts or nipples. ? A swollen area on either breast. ? A fever or chills. ? Nausea or vomiting. ? Drainage other than breast milk from your nipples.  Your breasts do not become full before feedings by the fifth day after you give birth.  You feel sad and depressed.  Your baby is: ? Too sleepy to eat well. ? Having trouble sleeping. ? More than 441 week old and wetting fewer than 6 diapers in a 24-hour period. ? Not gaining weight by 785 days of age.  Your baby has fewer than 3 stools in a 24-hour period.  Your baby's skin or the white parts of his or her eyes become yellow. Get help right away if:  Your baby is overly tired (lethargic) and does not want to wake up and feed.  Your baby develops an unexplained fever. Summary  Breastfeeding offers many health benefits for infant and mothers.  Try to breastfeed your infant when he or she shows early signs of hunger.  Gently tickle or stroke your baby's lips with your finger or nipple to allow the baby to open his or her mouth. Bring the baby to your breast. Make sure that much of the areola is in your baby's mouth. Offer one side and burp the baby before you offer the other side.  Talk with your health care provider or lactation consultant if you have questions or you face problems as you breastfeed. This information is not intended to replace advice given to you by your health care provider. Make sure you discuss any questions you have with your health care provider. Document Released: 11/12/2005 Document  Revised: 12/14/2016 Document Reviewed: 12/14/2016 Elsevier Interactive Patient Education  2018 [employer] Cesarean Delivery, Care After Refer to this sheet in the next few weeks. These instructions provide you with information about caring for yourself after your procedure. Your health care  provider may also give you more specific instructions. Your treatment has been planned according to current medical practices, but problems sometimes occur. Call your health care provider if you have any problems or questions after your procedure. What can I expect after the procedure? After the procedure, it is common to have:  A small amount of blood or clear fluid coming from the incision.  Some redness, swelling, and pain in your incision area.  Some abdominal pain and soreness.  Vaginal bleeding (lochia).  Pelvic cramps.  Fatigue.  Follow these instructions at home: Incision care   Follow instructions from your health care provider about how to take care of your incision. Make sure you: ? Wash your hands with soap and water before you change your bandage (dressing). If soap and water are not available, use hand sanitizer. ? If you have a dressing, change it as told by your health care provider. ? Leave stitches (sutures), skin staples, skin glue, or adhesive strips in place. These skin closures may need to stay in place for 2 weeks or longer. If adhesive strip edges start to loosen and curl up, you may trim the loose edges. Do not remove adhesive strips completely unless your health care provider tells you to do that.  Check your incision area every day for signs of infection. Check for: ? More redness, swelling, or pain. ? More fluid or blood. ? Warmth. ? Pus or a bad smell.  When you cough or sneeze, hug a pillow. This helps with pain and decreases the chance of your incision opening up (dehiscing). Do this until your incision heals. Medicines  Take over-the-counter and prescription  medicines only as told by your health care provider.  If you were prescribed an antibiotic medicine, take it as told by your health care provider. Do not stop taking the antibiotic until it is finished. Driving  Do not drive or operate heavy machinery while taking prescription pain medicine. Lifestyle  Do not drink alcohol. This is especially important if you are breastfeeding or taking pain medicine.  Do not use tobacco products, including cigarettes, chewing tobacco, or e-cigarettes. If you need help quitting, ask your health care provider. Tobacco can delay wound healing. Eating and drinking  Drink at least 8 eight-ounce glasses of water every day unless told not to by your health care provider. If you breastfeed, you may need to drink more water than this.  Eat high-fiber foods every day. These foods may help prevent or relieve constipation. High-fiber foods include: ? Whole grain cereals and breads. ? Brown rice. ? Beans. ? Fresh fruits and vegetables. Activity  Return to your normal activities as told by your health care provider. Ask your health care provider what activities are safe for you.  Rest as much as possible. Try to rest or take a nap while your baby is sleeping.  Do not lift anything that is heavier than your baby or 10 lb (4.5 kg) as told by your health care provider.  Ask your health care provider when you can engage in sexual activity. This may depend on your: ? Risk of infection. ? Healing rate. ? Comfort and desire to engage in sexual activity. Bathing  Do not take baths, swim, or use a hot tub until your health care provider approves. Ask your health care provider if you can take showers. You may only be allowed to take sponge baths until your incision heals. General instructions  Do not use tampons or douches until your health  care provider approves.  Wear: ? Loose, comfortable clothing. ? A supportive and well-fitting bra.  Watch for any blood  clots that may pass from your vagina. These may look like clumps of dark red, brown, or black discharge.  Keep your perineum clean and dry as told by your health care provider.  Wipe from front to back when you use the toilet.  If possible, have someone help you care for your baby and help with household activities for a few days after you leave the hospital.  Keep all follow-up visits for you and your baby as told by your health care provider. This is important. Contact a health care provider if:  You have: ? Bad-smelling vaginal discharge. ? Difficulty urinating. ? Pain when urinating. ? A sudden increase or decrease in the frequency of your bowel movements. ? More redness, swelling, or pain around your incision. ? More fluid or blood coming from your incision. ? Pus or a bad smell coming from your incision. ? A fever. ? A rash. ? Little or no interest in activities you used to enjoy. ? Questions about caring for yourself or your baby. ? Nausea.  Your incision feels warm to the touch.  Your breasts turn red or become painful or hard.  You feel unusually sad or worried.  You vomit.  You pass large blood clots from your vagina. If you pass a blood clot, save it to show to your health care provider. Do not flush blood clots down the toilet without showing your health care provider.  You urinate more than usual.  You are dizzy or light-headed.  You have not breastfed and have not had a menstrual period for 12 weeks after delivery.  You stopped breastfeeding and have not had a menstrual period for 12 weeks after stopping breastfeeding. Get help right away if:  You have: ? Pain that does not go away or get better with medicine. ? Chest pain. ? Difficulty breathing. ? Blurred vision or spots in your vision. ? Thoughts about hurting yourself or your baby. ? New pain in your abdomen or in one of your legs. ? A severe headache.  You faint.  You bleed from your vagina so  much that you fill two sanitary pads in one hour. This information is not intended to replace advice given to you by your health care provider. Make sure you discuss any questions you have with your health care provider. Document Released: 08/04/2002 Document Revised: 12/15/2016 Document Reviewed: 10/17/2015 Elsevier Interactive Patient Education  [employer]2018 Elsevier Inc.

## 2018-07-08 NOTE — Progress Notes (Signed)
Progress Note   S: Denies headaches, visual changes, lightheadedness, N/V, ROQ pain.  O: Blood pressures in mild range 152/90, 128/94, 127/97 Pulse 64-74; pulse O2 98-100% General: in NAD  Results for orders placed or performed during the hospital encounter of 07/05/18 (from the past 24 hour(s))  Comprehensive metabolic panel     Status: Abnormal   Collection Time: 07/07/18  9:53 AM  Result Value Ref Range   Sodium 139 135 - 145 mmol/L   Potassium 3.7 3.5 - 5.1 mmol/L   Chloride 106 98 - 111 mmol/L   CO2 27 22 - 32 mmol/L   Glucose, Bld 92 70 - 99 mg/dL   BUN 10 6 - 20 mg/dL   Creatinine, Ser 0.980.77 0.44 - 1.00 mg/dL   Calcium 8.4 (L) 8.9 - 10.3 mg/dL   Total Protein 6.2 (L) 6.5 - 8.1 g/dL   Albumin 2.4 (L) 3.5 - 5.0 g/dL   AST 26 15 - 41 U/L   ALT 11 0 - 44 U/L   Alkaline Phosphatase 94 38 - 126 U/L   Total Bilirubin 0.3 0.3 - 1.2 mg/dL   GFR calc non Af Amer >60 >60 mL/min   GFR calc Af Amer >60 >60 mL/min   Anion gap 6 5 - 15  CBC     Status: Abnormal   Collection Time: 07/07/18  9:53 AM  Result Value Ref Range   WBC 8.0 3.6 - 11.0 K/uL   RBC 3.08 (L) 3.80 - 5.20 MIL/uL   Hemoglobin 7.4 (L) 12.0 - 16.0 g/dL   HCT 11.922.5 (L) 14.735.0 - 82.947.0 %   MCV 73.2 (L) 80.0 - 100.0 fL   MCH 23.9 (L) 26.0 - 34.0 pg   MCHC 32.7 32.0 - 36.0 g/dL   RDW 56.216.5 (H) 13.011.5 - 86.514.5 %   Platelets 227 150 - 440 K/uL  Protein / creatinine ratio, urine     Status: Abnormal   Collection Time: 07/07/18  6:15 PM  Result Value Ref Range   Creatinine, Urine 31 mg/dL   Total Protein, Urine 9 mg/dL   Protein Creatinine Ratio 0.29 (H) 0.00 - 0.15 mg/mg[Cre]    A: Gestational hypertension Anemia  P: Discussed plan of management with Dr Jerene PitchSchuman Procardia 30 mgm XL daily  Farrel Connersolleen Laqueena Hinchey, CNM

## 2018-07-11 ENCOUNTER — Ambulatory Visit: Payer: Medicaid Other | Admitting: Obstetrics and Gynecology

## 2019-03-18 ENCOUNTER — Other Ambulatory Visit: Payer: Self-pay

## 2019-03-18 ENCOUNTER — Encounter: Payer: Self-pay | Admitting: Emergency Medicine

## 2019-03-18 ENCOUNTER — Emergency Department: Payer: Medicaid Other

## 2019-03-18 ENCOUNTER — Emergency Department
Admission: EM | Admit: 2019-03-18 | Discharge: 2019-03-18 | Disposition: A | Discharge status: Home / Self Care | Payer: Medicaid Other | Attending: Student in an Organized Health Care Education/Training Program | Admitting: Student in an Organized Health Care Education/Training Program

## 2019-03-18 DIAGNOSIS — N3000 Acute cystitis without hematuria: Secondary | ICD-10-CM

## 2019-03-18 DIAGNOSIS — Z87891 Personal history of nicotine dependence: Secondary | ICD-10-CM | POA: Diagnosis not present

## 2019-03-18 DIAGNOSIS — Z20828 Contact with and (suspected) exposure to other viral communicable diseases: Secondary | ICD-10-CM | POA: Diagnosis not present

## 2019-03-18 DIAGNOSIS — R0602 Shortness of breath: Secondary | ICD-10-CM | POA: Diagnosis present

## 2019-03-18 DIAGNOSIS — Z79899 Other long term (current) drug therapy: Secondary | ICD-10-CM | POA: Diagnosis not present

## 2019-03-18 LAB — SARS CORONAVIRUS 2 BY RT PCR (HOSPITAL ORDER, PERFORMED IN ~~LOC~~ HOSPITAL LAB): SARS Coronavirus 2: NEGATIVE

## 2019-03-18 LAB — COMPREHENSIVE METABOLIC PANEL
ALT: 16 U/L (ref 0–44)
AST: 17 U/L (ref 15–41)
Albumin: 3.9 g/dL (ref 3.5–5.0)
Alkaline Phosphatase: 96 U/L (ref 38–126)
Anion gap: 7 (ref 5–15)
BUN: 14 mg/dL (ref 6–20)
CO2: 29 mmol/L (ref 22–32)
Calcium: 8.8 mg/dL — ABNORMAL LOW (ref 8.9–10.3)
Chloride: 104 mmol/L (ref 98–111)
Creatinine, Ser: 0.98 mg/dL (ref 0.44–1.00)
GFR calc Af Amer: >60 mL/min (ref >60)
GFR calc non Af Amer: >60 mL/min (ref >60)
Glucose, Bld: 99 mg/dL (ref 70–99)
Potassium: 3.5 mmol/L (ref 3.5–5.1)
Sodium: 140 mmol/L (ref 135–145)
Total Bilirubin: 0.4 mg/dL (ref 0.3–1.2)
Total Protein: 8 g/dL (ref 6.5–8.1)

## 2019-03-18 LAB — CBC WITH DIFFERENTIAL/PLATELET
Abs Immature Granulocytes: 0.02 10*3/uL (ref 0.00–0.07)
Basophils Absolute: 0 10*3/uL (ref 0.0–0.1)
Basophils Relative: 0 %
Eosinophils Absolute: 0 10*3/uL (ref 0.0–0.5)
Eosinophils Relative: 0 %
HCT: 42.4 % (ref 36.0–46.0)
Hemoglobin: 13.1 g/dL (ref 12.0–15.0)
Immature Granulocytes: 0 %
Lymphocytes Relative: 18 %
Lymphs Abs: 1.1 10*3/uL (ref 0.7–4.0)
MCH: 25.7 pg — ABNORMAL LOW (ref 26.0–34.0)
MCHC: 30.9 g/dL (ref 30.0–36.0)
MCV: 83.3 fL (ref 80.0–100.0)
Monocytes Absolute: 0.5 10*3/uL (ref 0.1–1.0)
Monocytes Relative: 7 %
Neutro Abs: 4.7 10*3/uL (ref 1.7–7.7)
Neutrophils Relative %: 75 %
Platelets: 204 10*3/uL (ref 150–400)
RBC: 5.09 MIL/uL (ref 3.87–5.11)
RDW: 14.7 % (ref 11.5–15.5)
WBC: 6.4 10*3/uL (ref 4.0–10.5)
nRBC: 0 % (ref 0.0–0.2)

## 2019-03-18 LAB — URINALYSIS, COMPLETE (UACMP) WITH MICROSCOPIC
Bilirubin Urine: NEGATIVE
Glucose, UA: NEGATIVE mg/dL
Ketones, ur: NEGATIVE mg/dL
Nitrite: POSITIVE — AB
Protein, ur: NEGATIVE mg/dL
Specific Gravity, Urine: 1.014 (ref 1.005–1.030)
Squamous Epithelial / LPF: NONE SEEN (ref 0–5)
WBC, UA: >50 WBC/hpf — ABNORMAL HIGH (ref 0–5)
pH: 7 (ref 5.0–8.0)

## 2019-03-18 LAB — INFLUENZA PANEL BY PCR (TYPE A & B)
Influenza A By PCR: NEGATIVE
Influenza B By PCR: NEGATIVE

## 2019-03-18 LAB — POCT PREGNANCY, URINE: Preg Test, Ur: NEGATIVE

## 2019-03-18 MED ORDER — SODIUM CHLORIDE 0.9 % IV SOLN
1.0000 g | Freq: Once | INTRAVENOUS | Status: AC
  Administered 2019-03-18: 1 g via INTRAVENOUS
  Filled 2019-03-18: qty 10

## 2019-03-18 MED ORDER — CEFDINIR 300 MG PO CAPS: 300.0000 mg | ORAL_CAPSULE | Freq: Two times a day (BID) | ORAL | 0 refills | Status: AC

## 2019-03-18 MED ORDER — SODIUM CHLORIDE 0.9 % IV BOLUS
500.0000 mL | Freq: Once | INTRAVENOUS | Status: AC
  Administered 2019-03-18: 18:00:00 500 mL via INTRAVENOUS

## 2019-03-18 MED ORDER — ACETAMINOPHEN 500 MG PO TABS
1000.0000 mg | ORAL_TABLET | Freq: Once | ORAL | Status: AC
  Administered 2019-03-18: 18:00:00 1000 mg via ORAL
  Filled 2019-03-18: qty 2

## 2019-03-18 NOTE — ED Triage Notes (Signed)
Pt presents to ED via POV with c/o cough. Pt states has been feeling hot despite the air being on. Pt with slight dry cough noted in triage.

## 2019-03-18 NOTE — ED Notes (Signed)
Pt verbalized understanding of d/c instructions, RX, and f/u care. No further questions at this time. Pt ambulatory to the exit with steady gait  

## 2019-03-18 NOTE — ED Provider Notes (Addendum)
[facility] Emergency Department Provider Note    First MD Initiated Contact with Patient 03/18/19 1704     (approximate)  I have reviewed the triage vital signs and the nursing notes.   HISTORY  Chief Complaint Cough    HPI Robin Wood is a 23 y.o. female below listed past medical history presents the ER for cough shortness of breath fever headache and congestion for the past 2 to 3 days that became worse today.  She does feel winded with exertion.  Denies any dysuria diarrhea nausea or vomiting.  Has had a normal appetite.  Did have a coworker that was sick with productive cough a week ago.  That person reportedly did not test positive for coronavirus.  She denies any chronic medical conditions.    Past Medical History:  Diagnosis Date  . Anxiety   . Insomnia    History reviewed. No pertinent family history. Past Surgical History:  Procedure Laterality Date  . CESAREAN SECTION N/A 07/05/2018   Procedure: CESAREAN SECTION;  Surgeon: Conard Novak, MD;  Location: [facility] ORS;  Service: Obstetrics;  Laterality: N/A;  . NO PAST SURGERIES     Patient Active Problem List   Diagnosis Date Noted  . Malpresentation of fetus, antepartum 07/05/2018  . Chlamydia infection affecting pregnancy in third trimester, antepartum 07/05/2018  . Trichomonas vaginitis 07/05/2018  . Labor and delivery, indication for care 07/03/2018  . Indication for care in labor and delivery, antepartum 04/16/2018      Prior to Admission medications   Medication Sig Start Date End Date Taking? Authorizing Provider  cefdinir (OMNICEF) 300 MG capsule Take 1 capsule (300 mg total) by mouth 2 (two) times daily for 7 days. 03/18/19 03/25/19  Willy Eddy, MD  ferrous sulfate 325 (65 FE) MG tablet Take 1 tablet (325 mg total) by mouth 2 (two) times daily with a meal. 07/08/18   Schuman, Christanna R, MD  ibuprofen (ADVIL,MOTRIN) 600 MG tablet Take 1 tablet (600 mg total) by mouth  every 6 (six) hours. 07/08/18   Schuman, Jaquelyn Bitter, MD  medroxyPROGESTERone (DEPO-PROVERA) 150 MG/ML injection Inject 1 mL (150 mg total) into the muscle every 3 (three) months. 07/08/18   Schuman, Jaquelyn Bitter, MD  NIFEdipine (PROCARDIA-XL/ADALAT CC) 30 MG 24 hr tablet Take 1 tablet (30 mg total) by mouth daily. 07/08/18   Schuman, Jaquelyn Bitter, MD  oxyCODONE-acetaminophen (PERCOCET/ROXICET) 5-325 MG tablet Take 1 tablet by mouth every 4 (four) hours as needed for moderate pain (pain score 4-7/10). 07/08/18   Schuman, Jaquelyn Bitter, MD  Prenatal Vit-Fe Fumarate-FA (PRENATAL MULTIVITAMIN) TABS tablet Take 1 tablet by mouth daily at 12 noon.    [provider]    Allergies Penicillins    Social History Social History   Tobacco Use  . Smoking status: Former Smoker    Types: Cigarettes    Last attempt to quit: 12/17/2017    Years since quitting: 1.2  . Smokeless tobacco: Never Used  Substance Use Topics  . Alcohol use: Not Currently    Frequency: Never  . Drug use: No    Review of Systems Patient denies headaches, rhinorrhea, blurry vision, numbness, shortness of breath, chest pain, edema, cough, abdominal pain, nausea, vomiting, diarrhea, dysuria, fevers, rashes or hallucinations unless otherwise stated above in HPI. ____________________________________________   PHYSICAL EXAM:  VITAL SIGNS: Vitals:   03/18/19 1859 03/18/19 1915  BP: 104/78 (!) 114/93  Pulse: 92 93  Resp:  16  Temp:  (!) 100.6 F (  38.1 C)  SpO2: 98% 100%    Constitutional: Alert and oriented.  Eyes: Conjunctivae are normal.  Head: Atraumatic. Nose: No congestion/rhinnorhea. Mouth/Throat: Mucous membranes are moist. Uvula midline, no pta or rpa  Neck: No stridor. Painless ROM.  Cardiovascular: mildly tachycardic rate, regular rhythm. Grossly normal heart sounds.  Good peripheral circulation. Respiratory: Normal respiratory effort.  No retractions. Lungs with end expiratory wheeze.  Gastrointestinal: Soft and nontender. No distention. No abdominal bruits. No CVA tenderness. Genitourinary:  Musculoskeletal: No lower extremity tenderness nor edema.  No joint effusions. Neurologic:  Normal speech and language. No gross focal neurologic deficits are appreciated. No facial droop Skin:  Skin is warm, dry and intact. No rash noted. Psychiatric: Mood and affect are normal. Speech and behavior are normal.  ____________________________________________   LABS (all labs ordered are listed, but only abnormal results are displayed)  Results for orders placed or performed during the hospital encounter of 03/18/19 (from the past 24 hour(s))  Pregnancy, urine POC     Status: None   Collection Time: 03/18/19  5:26 PM  Result Value Ref Range   Preg Test, Ur NEGATIVE NEGATIVE  CBC with Differential/Platelet     Status: Abnormal   Collection Time: 03/18/19  5:29 PM  Result Value Ref Range   WBC 6.4 4.0 - 10.5 K/uL   RBC 5.09 3.87 - 5.11 MIL/uL   Hemoglobin 13.1 12.0 - 15.0 g/dL   HCT 77.0 34.0 - 35.2 %   MCV 83.3 80.0 - 100.0 fL   MCH 25.7 (L) 26.0 - 34.0 pg   MCHC 30.9 30.0 - 36.0 g/dL   RDW 48.1 85.9 - 09.3 %   Platelets 204 150 - 400 K/uL   nRBC 0.0 0.0 - 0.2 %   Neutrophils Relative % 75 %   Neutro Abs 4.7 1.7 - 7.7 K/uL   Lymphocytes Relative 18 %   Lymphs Abs 1.1 0.7 - 4.0 K/uL   Monocytes Relative 7 %   Monocytes Absolute 0.5 0.1 - 1.0 K/uL   Eosinophils Relative 0 %   Eosinophils Absolute 0.0 0.0 - 0.5 K/uL   Basophils Relative 0 %   Basophils Absolute 0.0 0.0 - 0.1 K/uL   Immature Granulocytes 0 %   Abs Immature Granulocytes 0.02 0.00 - 0.07 K/uL  Comprehensive metabolic panel     Status: Abnormal   Collection Time: 03/18/19  5:29 PM  Result Value Ref Range   Sodium 140 135 - 145 mmol/L   Potassium 3.5 3.5 - 5.1 mmol/L   Chloride 104 98 - 111 mmol/L   CO2 29 22 - 32 mmol/L   Glucose, Bld 99 70 - 99 mg/dL   BUN 14 6 - 20 mg/dL   Creatinine, Ser 1.12 0.44 -  1.00 mg/dL   Calcium 8.8 (L) 8.9 - 10.3 mg/dL   Total Protein 8.0 6.5 - 8.1 g/dL   Albumin 3.9 3.5 - 5.0 g/dL   AST 17 15 - 41 U/L   ALT 16 0 - 44 U/L   Alkaline Phosphatase 96 38 - 126 U/L   Total Bilirubin 0.4 0.3 - 1.2 mg/dL   GFR calc non Af Amer >60 >60 mL/min   GFR calc Af Amer >60 >60 mL/min   Anion gap 7 5 - 15  Influenza panel by PCR (type A & B)     Status: None   Collection Time: 03/18/19  5:29 PM  Result Value Ref Range   Influenza A By PCR NEGATIVE NEGATIVE   Influenza  B By PCR NEGATIVE NEGATIVE  Urinalysis, Complete w Microscopic     Status: Abnormal   Collection Time: 03/18/19  5:29 PM  Result Value Ref Range   Color, Urine YELLOW (A) YELLOW   APPearance HAZY (A) CLEAR   Specific Gravity, Urine 1.014 1.005 - 1.030   pH 7.0 5.0 - 8.0   Glucose, UA NEGATIVE NEGATIVE mg/dL   Hgb urine dipstick SMALL (A) NEGATIVE   Bilirubin Urine NEGATIVE NEGATIVE   Ketones, ur NEGATIVE NEGATIVE mg/dL   Protein, ur NEGATIVE NEGATIVE mg/dL   Nitrite POSITIVE (A) NEGATIVE   Leukocytes,Ua LARGE (A) NEGATIVE   RBC / HPF 11-20 0 - 5 RBC/hpf   WBC, UA >50 (H) 0 - 5 WBC/hpf   Bacteria, UA MANY (A) NONE SEEN   Squamous Epithelial / LPF NONE SEEN 0 - 5   Mucus PRESENT   SARS Coronavirus 2 [facility](Hospital order, [facility])     Status: None   Collection Time: 03/18/19  5:29 PM  Result Value Ref Range   SARS Coronavirus 2 NEGATIVE NEGATIVE   ________________________________________________________  RADIOLOGY  I personally reviewed all radiographic images ordered to evaluate for the above acute complaints and reviewed radiology reports and findings.  These findings were personally discussed with the patient.  Please see medical record for radiology report.  ____________________________________________   PROCEDURES  Procedure(s) performed:  Procedures    Critical Care performed: no ____________________________________________   INITIAL IMPRESSION /  ASSESSMENT AND PLAN / ED COURSE  Pertinent labs & imaging results that were available during my care of the patient were reviewed by me and considered in my medical decision making (see chart for details).   DDX: uri, pna, asthma, bronchtis, uti, enteritis, viral illness, sepsis  Robin NightFrances Wirtanen is a 23 y.o. who presents to the ED with symptoms as described above.  Patient febrile and tachycardic but overall very well-appearing.  She does not have any hypoxia.  Does have some congestion.  Will test for flu COVID of the above differential.  Also having some urinary discomfort.  Her abdominal exam is soft and benign.  She is not any have any CVA tenderness.  No signs or symptoms to suggest stone.  Does not seem consistent with appendicitis colitis or other acute intra-abdominal process requiring abdominal imaging.  Will give IV fluids and reassess.  Clinical Course as of Mar 18 1931  Wed Mar 18, 2019  1751 Blood work thus far is reassuring.  Patient does have evidence of nitrite positive UTI.  Her abdominal exam is soft and benign.   [PR]    Clinical Course User Index [PR] Willy Eddyobinson, Chelesa Weingartner, MD   ----------------------------------------- 7:32 PM on 03/18/2019 -----------------------------------------   Patient was able to tolerate PO and was able to ambulate with a steady gait.  The patient was evaluated in Emergency Department today for the symptoms described in the history of present illness. He/she was evaluated in the context of the global COVID-19 pandemic, which necessitated consideration that the patient might be at risk for infection with the SARS-CoV-2 virus that causes COVID-19. Institutional protocols and algorithms that pertain to the evaluation of patients at risk for COVID-19 are in a state of rapid change based on information released by regulatory bodies including the CDC and federal and state organizations. These policies and algorithms were followed during the patient's care in  the ED. As part of my medical decision making, I reviewed the following data within the electronic MEDICAL RECORD NUMBER Nursing  notes reviewed and incorporated, Labs reviewed, notes from prior ED visits and [place] Controlled Substance Database   ____________________________________________   FINAL CLINICAL IMPRESSION(S) / ED DIAGNOSES  Final diagnoses:  Acute cystitis without hematuria      NEW MEDICATIONS STARTED DURING THIS VISIT:  Discharge Medication List as of 03/18/2019  7:05 PM    START taking these medications   Details  cefdinir (OMNICEF) 300 MG capsule Take 1 capsule (300 mg total) by mouth 2 (two) times daily for 7 days., Starting Wed 03/18/2019, Until Wed 03/25/2019, Normal         Note:  This document was prepared using Dragon voice recognition software and may include unintentional dictation errors.    Willy Eddy, MD 03/18/19 1931    Willy Eddy, MD 03/18/19 [phone]

## 2019-03-19 ENCOUNTER — Telehealth: Payer: Self-pay | Admitting: Emergency Medicine

## 2019-03-19 NOTE — Telephone Encounter (Signed)
Called patient and informed her that covid 19 test was negative.

## 2019-09-11 IMAGING — US US OB COMP +14 WK
1 series · 13 of 28 positions shown · non-contrast
Comparison: none

CLINICAL DATA: Fetal growth restriction. No prenatal care.
Gestational age by first ultrasound is 38 weeks 5 days. EDC by first
ultrasound is 07/12/2018.

EXAM:
OBSTETRICAL ULTRASOUND >14 WKS

[Series 1: us ob comp +14 wk · 13 of 55 slices shown]
[im 3/55]
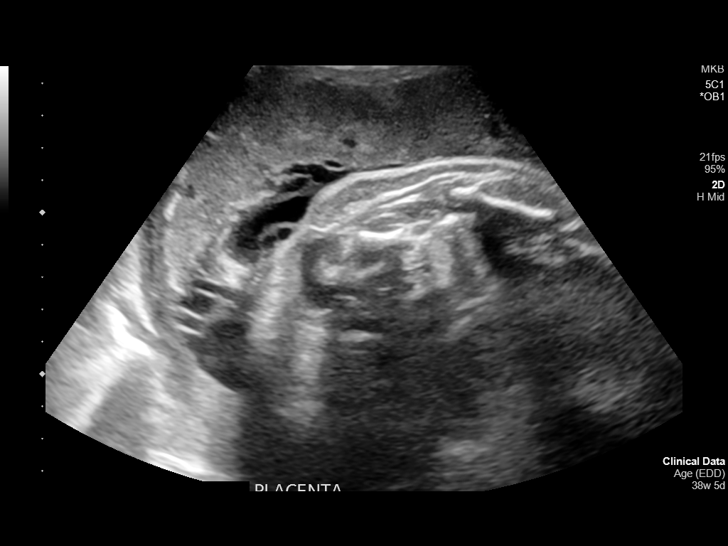
[im 7/55]
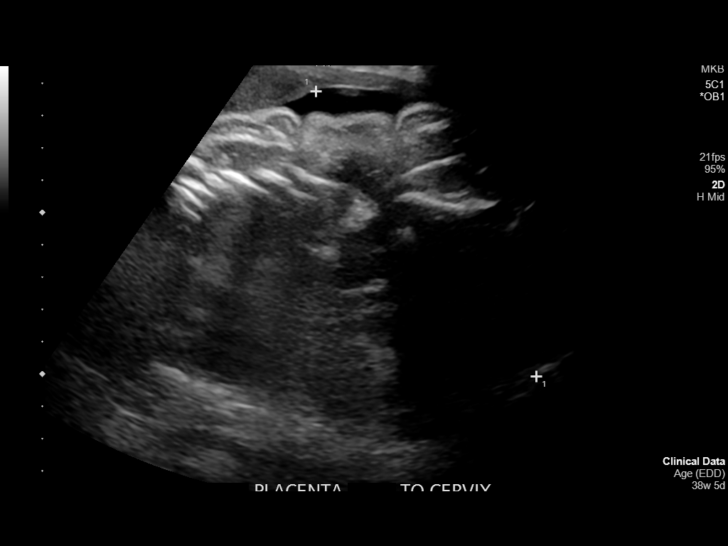
[im 11/55]
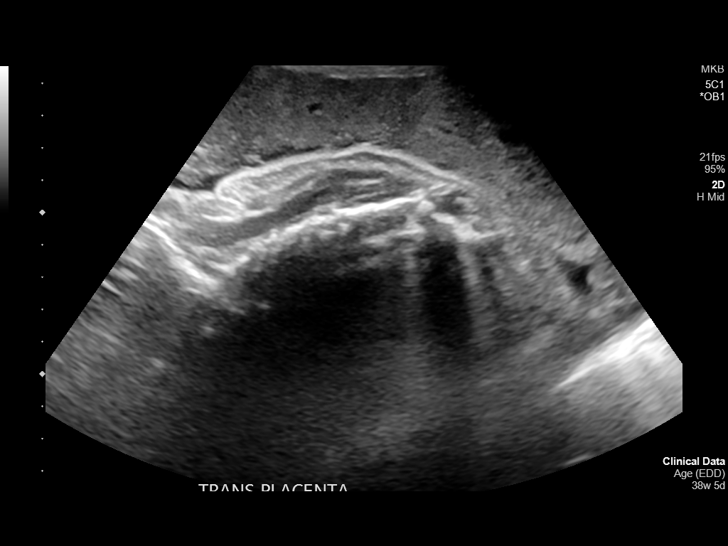
[im 15/55]
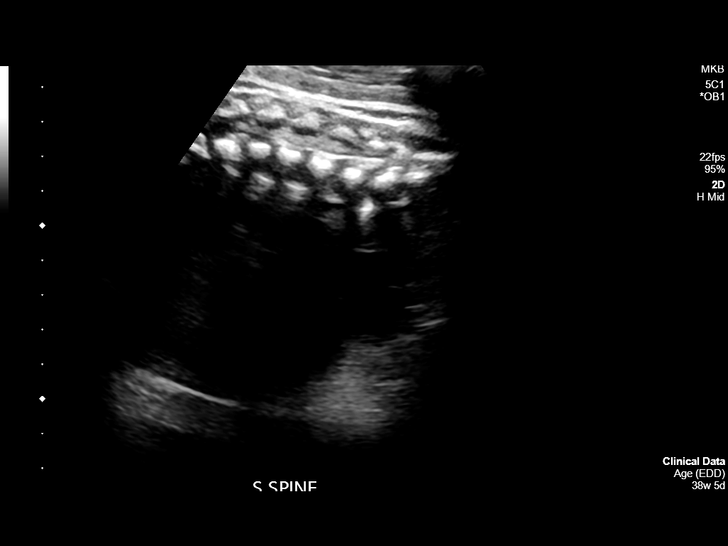
[im 19/55]
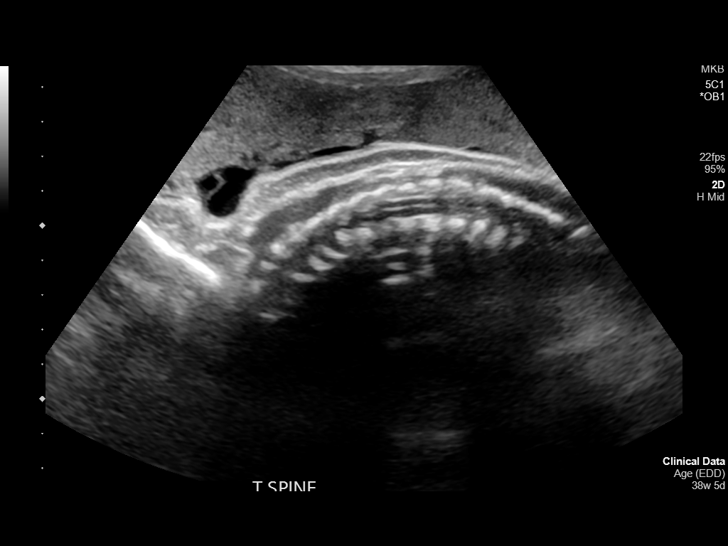
[im 23/55]
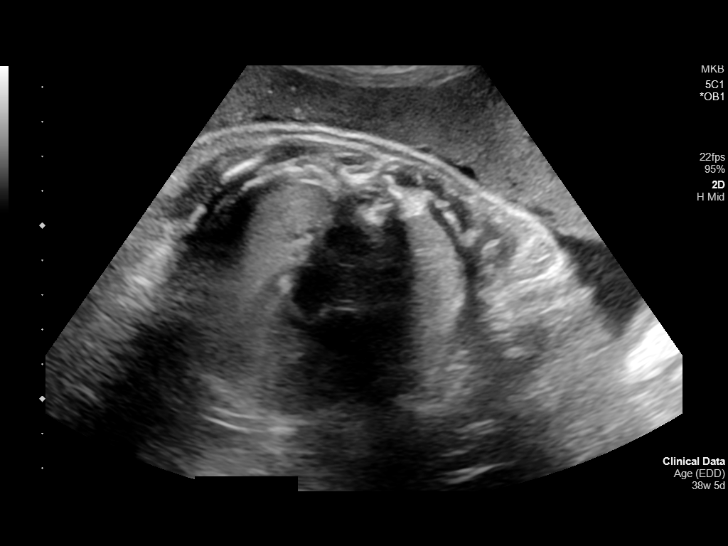
[im 29/55]
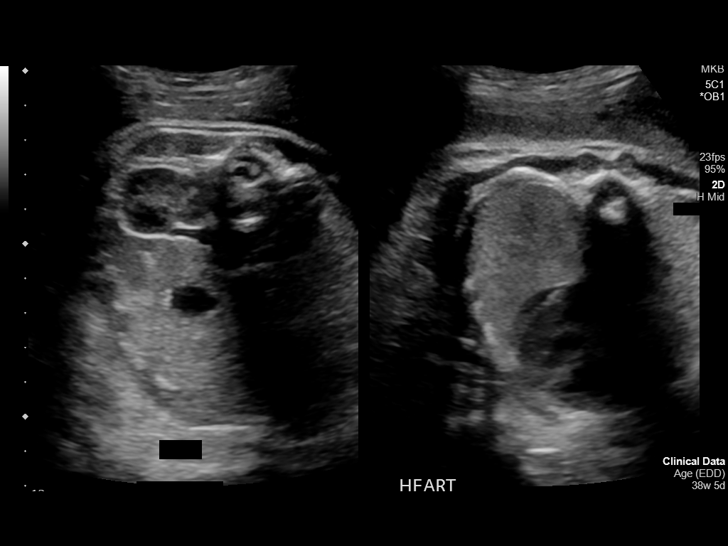
[im 33/55]
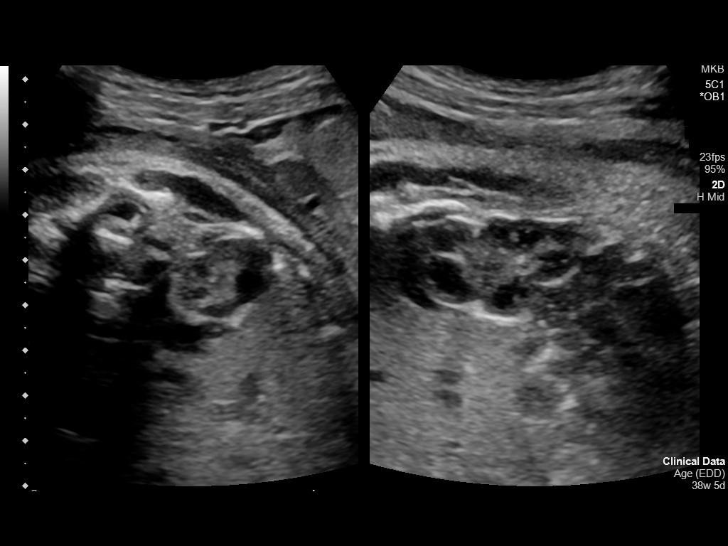
[im 37/55]
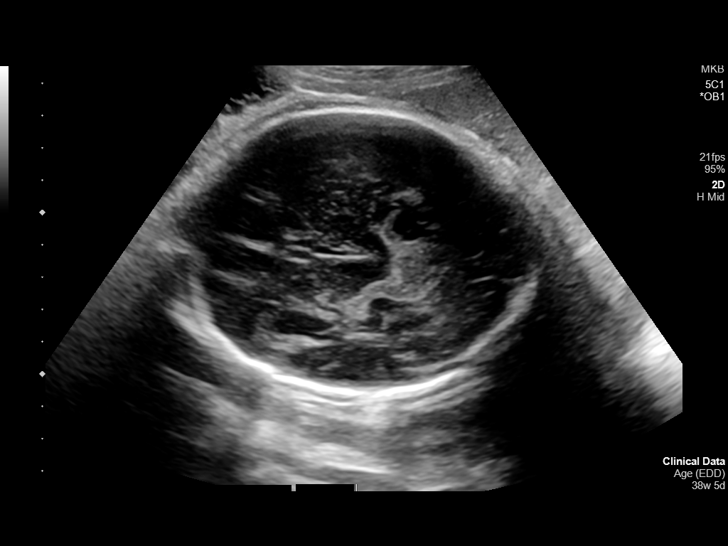
[im 41/55]
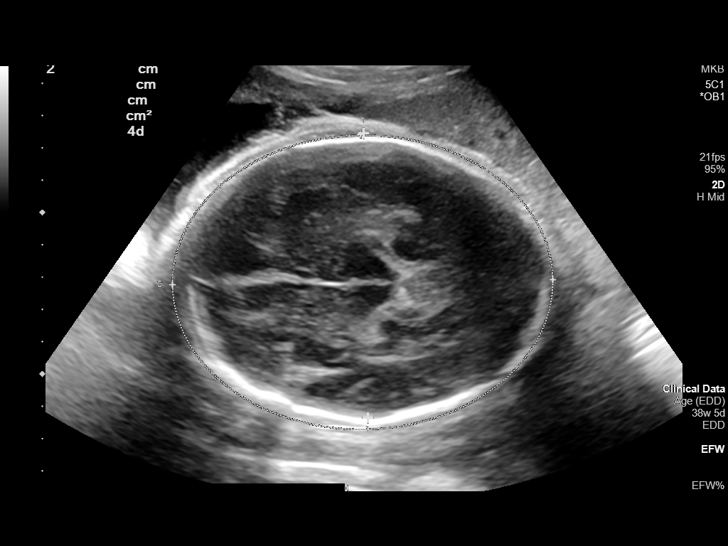
[im 45/55]
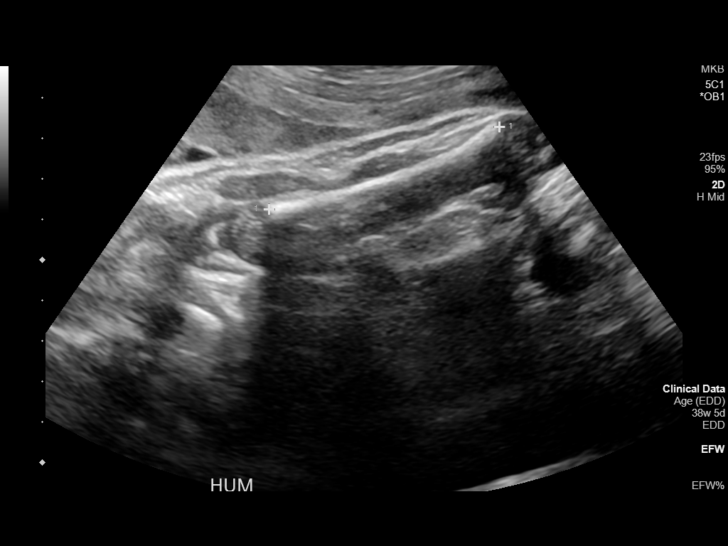
[im 49/55]
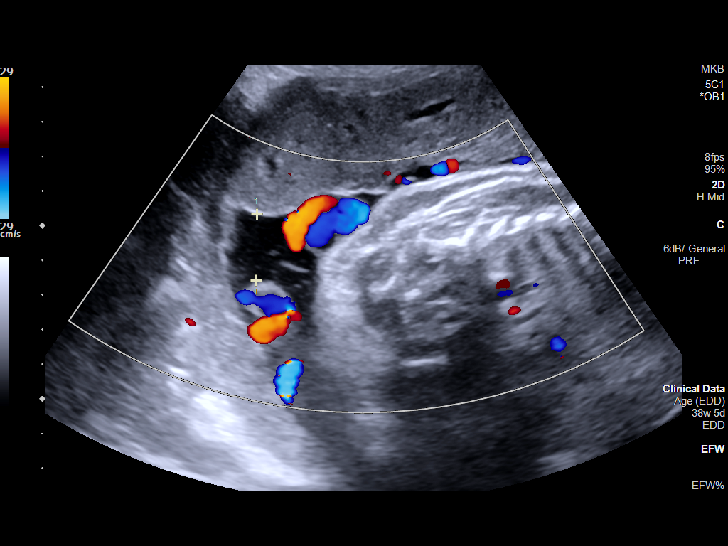
[im 53/55]
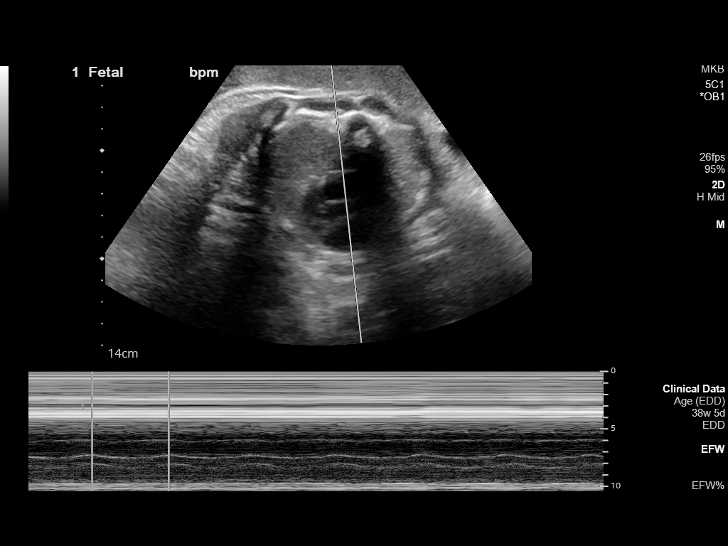

[13 of 28 positions shown; findings below may reference images not displayed]

FINDINGS: Number of Fetuses: 1

Heart Rate:  135 bpm

Movement: Present

Presentation: Present

Previa: Anterior

Placental Location: None

Amniotic Fluid (Subjective): Normal

Amniotic Fluid (Objective):

Vertical pocket 3.1cm

AFI 9.9 cm (5%ile= 7.2 cm, 95%= 22.6 cm for 39 wks)

FETAL BIOMETRY

BPD:  8.9cm 35w 6d

HC:    32.9cm 37w 3d

AC:   32.8cm 36w 5d

FL:   7.1cm 36w 3d

Current Mean GA: 36w 2d US EDC: 07/29/2018

Estimated Fetal Weight:  2989g 18.2%ile

FETAL ANATOMY

Lateral Ventricles: Appears normal

Thalami/CSP: Appears normal

Posterior Fossa:  Not visualized

Nuchal Region: Not visualized

Upper Lip: Not visualized

Spine: Appears normal

4 Chamber Heart on Left: Not visualized

LVOT: Not visualized

RVOT: Not visualized

Stomach on Left: Appears normal

3 Vessel Cord: Not visualized

Cord Insertion site: Not visualized

Kidneys: Appears normal

Bladder: Appears normal

Extremities: Not visualized

Sex: Not visualized

Technically difficult due to: Advanced gestational age

Maternal Findings:

Cervix:  Not evaluated
IMPRESSION: 1. Single living intrauterine fetus in breech presentation.
2. Fetal measurements lag behind assigned gestational age.
3. Estimated fetal weight is in the 18th percentile for gestational
age of 39 weeks.
4. Amniotic fluid volume in the LOWER ranges of normal.

## 2020-04-03 ENCOUNTER — Other Ambulatory Visit: Payer: Self-pay

## 2020-04-03 DIAGNOSIS — Z5321 Procedure and treatment not carried out due to patient leaving prior to being seen by health care provider: Secondary | ICD-10-CM | POA: Insufficient documentation

## 2020-04-03 DIAGNOSIS — H5710 Ocular pain, unspecified eye: Secondary | ICD-10-CM | POA: Diagnosis not present

## 2020-04-03 NOTE — ED Notes (Signed)
Patient reports symptoms for the past 2 weeks.

## 2020-04-04 ENCOUNTER — Other Ambulatory Visit: Payer: Self-pay

## 2020-04-04 ENCOUNTER — Emergency Department
Admission: EM | Admit: 2020-04-04 | Discharge: 2020-04-04 | Disposition: A | Discharge status: Home / Self Care | Payer: Medicaid Other | Attending: Emergency Medicine | Admitting: Emergency Medicine

## 2020-04-04 NOTE — ED Notes (Signed)
Patient observed resting quietly with eyes closed, no acute distress noted.

## 2020-04-04 NOTE — ED Notes (Signed)
Observed walking out of ED with friend who had also checked into ED to be seen.  Did not stop at stat desk to inform they were leaving.

## 2020-04-04 NOTE — ED Triage Notes (Signed)
PT to ED c/o "grey spot to eye". PT ordered some contacts from [place] and had trouble taking them out, scratched her ye with long nails and has been having pain and light sensitive and watery since. Upon assessment, looks more like and indentation to the sclera rather than a discoloration. SX have been ongoing for a couple weeks.

## 2020-05-26 IMAGING — DX PORTABLE CHEST - 1 VIEW
1 series · 1 of 1 positions shown · non-contrast
Comparison: None.

CLINICAL DATA: 22-year-old female with a history of cough

EXAM:
PORTABLE CHEST 1 VIEW

[chest ap]
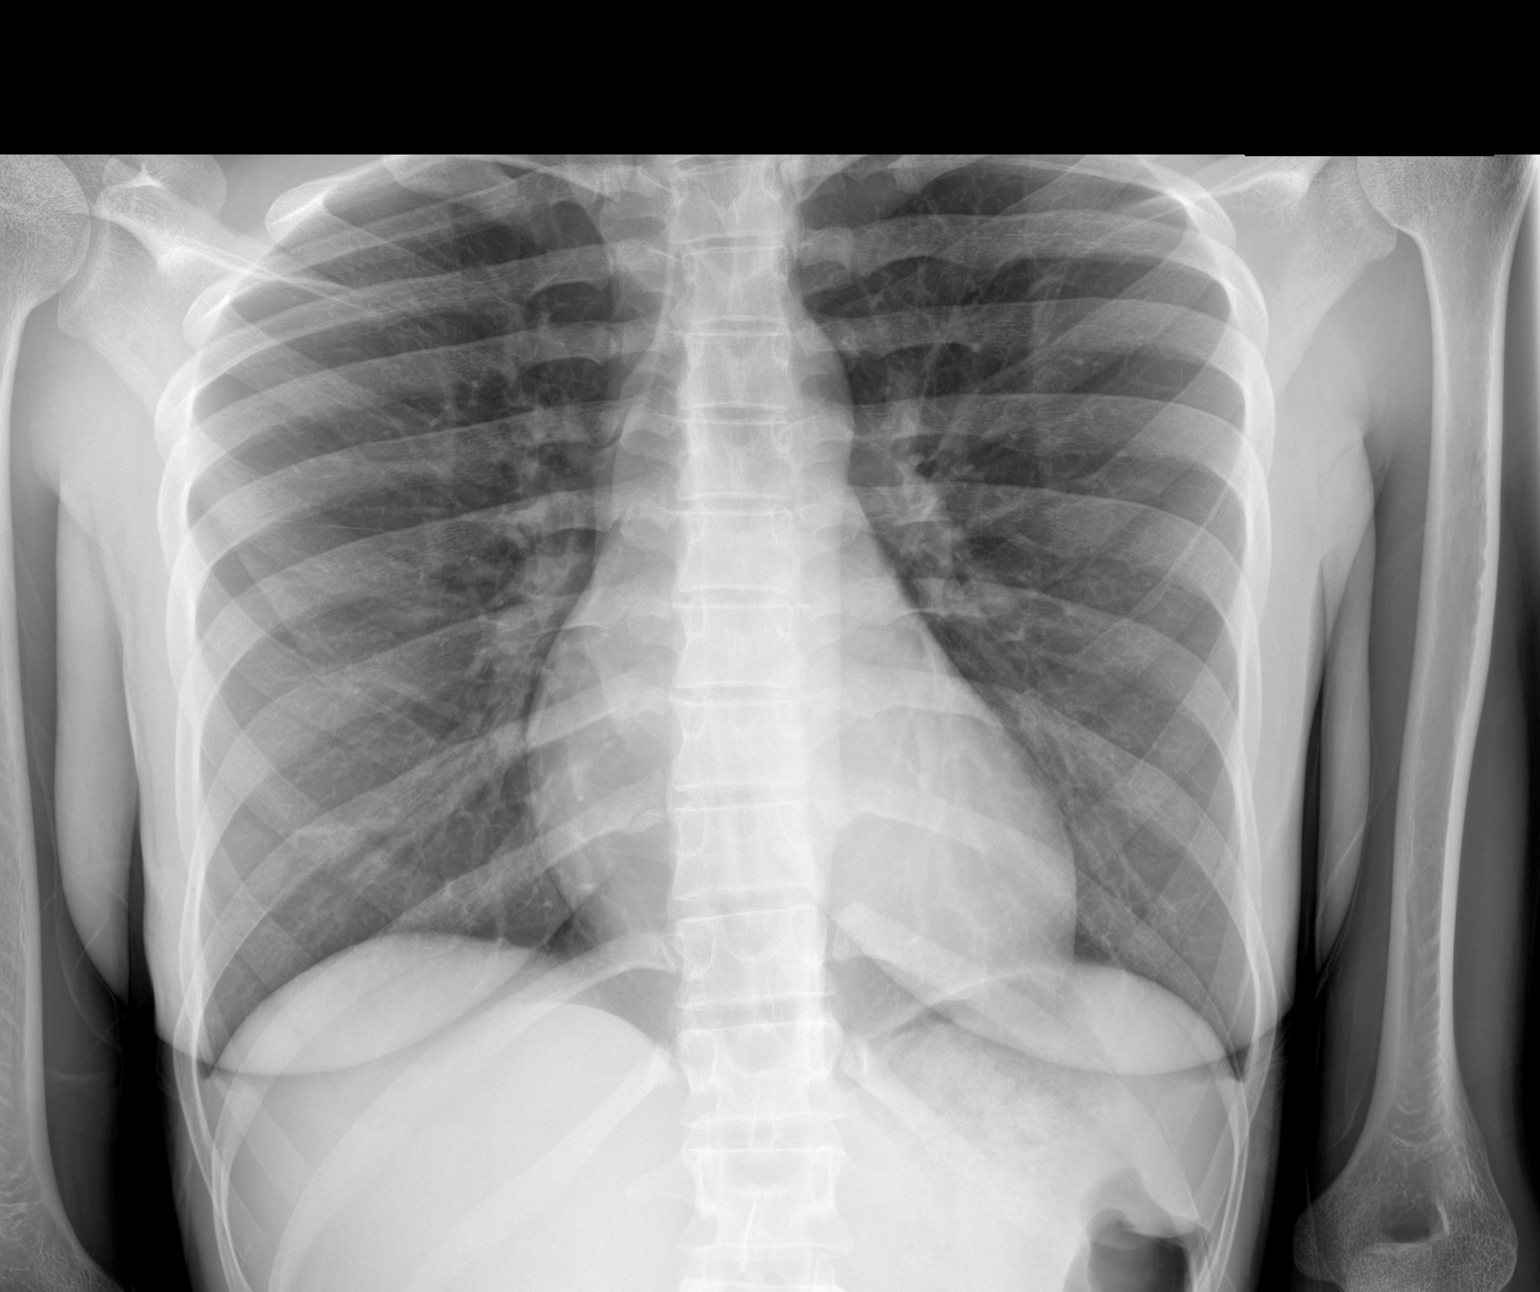

[1 of 1 positions shown; findings below may reference images not displayed]

FINDINGS: The heart size and mediastinal contours are within normal limits.
Both lungs are clear. The visualized skeletal structures are
unremarkable.
IMPRESSION: Negative for acute cardiopulmonary disease

## 2020-09-25 ENCOUNTER — Emergency Department: Payer: Medicaid Other

## 2020-09-25 ENCOUNTER — Other Ambulatory Visit: Payer: Self-pay

## 2020-09-25 ENCOUNTER — Emergency Department
Admission: EM | Admit: 2020-09-25 | Discharge: 2020-09-26 | Disposition: A | Discharge status: Other Acute Inpatient Hospital | Payer: Medicaid Other | Attending: Emergency Medicine | Admitting: Emergency Medicine

## 2020-09-25 DIAGNOSIS — Z046 Encounter for general psychiatric examination, requested by authority: Secondary | ICD-10-CM | POA: Insufficient documentation

## 2020-09-25 DIAGNOSIS — F29 Unspecified psychosis not due to a substance or known physiological condition: Secondary | ICD-10-CM | POA: Diagnosis not present

## 2020-09-25 DIAGNOSIS — R4182 Altered mental status, unspecified: Secondary | ICD-10-CM | POA: Diagnosis present

## 2020-09-25 DIAGNOSIS — F17210 Nicotine dependence, cigarettes, uncomplicated: Secondary | ICD-10-CM | POA: Diagnosis not present

## 2020-09-25 DIAGNOSIS — R000 Tachycardia, unspecified: Secondary | ICD-10-CM | POA: Insufficient documentation

## 2020-09-25 DIAGNOSIS — F209 Schizophrenia, unspecified: Secondary | ICD-10-CM | POA: Insufficient documentation

## 2020-09-25 DIAGNOSIS — Z20822 Contact with and (suspected) exposure to covid-19: Secondary | ICD-10-CM | POA: Diagnosis not present

## 2020-09-25 DIAGNOSIS — F1410 Cocaine abuse, uncomplicated: Secondary | ICD-10-CM

## 2020-09-25 DIAGNOSIS — R509 Fever, unspecified: Secondary | ICD-10-CM | POA: Diagnosis not present

## 2020-09-25 LAB — URINE DRUG SCREEN, QUALITATIVE (ARMC ONLY)
Amphetamines, Ur Screen: NOT DETECTED
Barbiturates, Ur Screen: NOT DETECTED
Benzodiazepine, Ur Scrn: NOT DETECTED
Cannabinoid 50 Ng, Ur ~~LOC~~: POSITIVE — AB
Cocaine Metabolite,Ur ~~LOC~~: POSITIVE — AB
MDMA (Ecstasy)Ur Screen: NOT DETECTED
Methadone Scn, Ur: NOT DETECTED
Opiate, Ur Screen: NOT DETECTED
Phencyclidine (PCP) Ur S: NOT DETECTED
Tricyclic, Ur Screen: NOT DETECTED

## 2020-09-25 LAB — URINALYSIS, COMPLETE (UACMP) WITH MICROSCOPIC
Bacteria, UA: NONE SEEN
Bilirubin Urine: NEGATIVE
Glucose, UA: NEGATIVE mg/dL
Hgb urine dipstick: NEGATIVE
Ketones, ur: NEGATIVE mg/dL
Leukocytes,Ua: NEGATIVE
Nitrite: NEGATIVE
Protein, ur: NEGATIVE mg/dL
Specific Gravity, Urine: >1.046 — ABNORMAL HIGH (ref 1.005–1.030)
pH: 6 (ref 5.0–8.0)

## 2020-09-25 LAB — COMPREHENSIVE METABOLIC PANEL
ALT: 13 U/L (ref 0–44)
AST: 18 U/L (ref 15–41)
Albumin: 4.3 g/dL (ref 3.5–5.0)
Alkaline Phosphatase: 62 U/L (ref 38–126)
Anion gap: 8 (ref 5–15)
BUN: 7 mg/dL (ref 6–20)
CO2: 25 mmol/L (ref 22–32)
Calcium: 9.1 mg/dL (ref 8.9–10.3)
Chloride: 105 mmol/L (ref 98–111)
Creatinine, Ser: 0.68 mg/dL (ref 0.44–1.00)
GFR, Estimated: >60 mL/min (ref >60)
Glucose, Bld: 116 mg/dL — ABNORMAL HIGH (ref 70–99)
Potassium: 3.2 mmol/L — ABNORMAL LOW (ref 3.5–5.1)
Sodium: 138 mmol/L (ref 135–145)
Total Bilirubin: 0.8 mg/dL (ref 0.3–1.2)
Total Protein: 8 g/dL (ref 6.5–8.1)

## 2020-09-25 LAB — CBC
HCT: 38.3 % (ref 36.0–46.0)
Hemoglobin: 12 g/dL (ref 12.0–15.0)
MCH: 25.4 pg — ABNORMAL LOW (ref 26.0–34.0)
MCHC: 31.3 g/dL (ref 30.0–36.0)
MCV: 81 fL (ref 80.0–100.0)
Platelets: 234 10*3/uL (ref 150–400)
RBC: 4.73 MIL/uL (ref 3.87–5.11)
RDW: 16.3 % — ABNORMAL HIGH (ref 11.5–15.5)
WBC: 4.1 10*3/uL (ref 4.0–10.5)
nRBC: 0 % (ref 0.0–0.2)

## 2020-09-25 LAB — CBC WITH DIFFERENTIAL/PLATELET
Abs Immature Granulocytes: 0.01 10*3/uL (ref 0.00–0.07)
Basophils Absolute: 0 10*3/uL (ref 0.0–0.1)
Basophils Relative: 0 %
Eosinophils Absolute: 0 10*3/uL (ref 0.0–0.5)
Eosinophils Relative: 0 %
HCT: 38.9 % (ref 36.0–46.0)
Hemoglobin: 12.2 g/dL (ref 12.0–15.0)
Immature Granulocytes: 0 %
Lymphocytes Relative: 28 %
Lymphs Abs: 1.2 10*3/uL (ref 0.7–4.0)
MCH: 25.1 pg — ABNORMAL LOW (ref 26.0–34.0)
MCHC: 31.4 g/dL (ref 30.0–36.0)
MCV: 80 fL (ref 80.0–100.0)
Monocytes Absolute: 0.4 10*3/uL (ref 0.1–1.0)
Monocytes Relative: 9 %
Neutro Abs: 2.6 10*3/uL (ref 1.7–7.7)
Neutrophils Relative %: 63 %
Platelets: 243 10*3/uL (ref 150–400)
RBC: 4.86 MIL/uL (ref 3.87–5.11)
RDW: 16.5 % — ABNORMAL HIGH (ref 11.5–15.5)
WBC: 4.2 10*3/uL (ref 4.0–10.5)
nRBC: 0 % (ref 0.0–0.2)

## 2020-09-25 LAB — SEDIMENTATION RATE: Sed Rate: 21 mm/hr — ABNORMAL HIGH (ref 0–20)

## 2020-09-25 LAB — ETHANOL: Alcohol, Ethyl (B): <10 mg/dL (ref <10)

## 2020-09-25 LAB — SALICYLATE LEVEL: Salicylate Lvl: <7 mg/dL — ABNORMAL LOW (ref 7.0–30.0)

## 2020-09-25 LAB — ACETAMINOPHEN LEVEL: Acetaminophen (Tylenol), Serum: 21 ug/mL (ref 10–30)

## 2020-09-25 MED ORDER — IOHEXOL 300 MG/ML  SOLN
75.0000 mL | Freq: Once | INTRAMUSCULAR | Status: AC | PRN
  Administered 2020-09-25: 75 mL via INTRAVENOUS

## 2020-09-25 MED ORDER — SODIUM CHLORIDE 0.9 % IV BOLUS
1000.0000 mL | Freq: Once | INTRAVENOUS | Status: AC
  Administered 2020-09-25: 1000 mL via INTRAVENOUS

## 2020-09-25 MED ORDER — DIPHENHYDRAMINE HCL 25 MG PO CAPS
25.0000 mg | ORAL_CAPSULE | Freq: Once | ORAL | Status: AC
  Administered 2020-09-25: 25 mg via ORAL
  Filled 2020-09-25: qty 1

## 2020-09-25 MED ORDER — ACETAMINOPHEN 325 MG PO TABS
650.0000 mg | ORAL_TABLET | Freq: Once | ORAL | Status: AC
  Administered 2020-09-25: 650 mg via ORAL
  Filled 2020-09-25: qty 2

## 2020-09-25 NOTE — ED Notes (Signed)
Robin Wood  [phone]

## 2020-09-25 NOTE — ED Triage Notes (Addendum)
When asked why she was here pt not answering question appropriately- pt began talking about her covid vaccine she got 2-3 weeks ago- per first nurse the pt's family member brought her in because she has been shutting down, not acting herself lately, and hearing voices- pt tearful in traige

## 2020-09-25 NOTE — ED Notes (Signed)
Pt slow to answer questions. Pt denies SI/HI, denies A/V hallucinations. Pt responds in short 1-4 word answers. When asked why she thinks she's here, pt begins to say something about "the (covid) shot" 3-4 weeks ago, and trails off. Pt affirms she has had a fever for "a couple days." Denies taking anything for it. Denies cough, or other symptoms

## 2020-09-25 NOTE — ED Provider Notes (Signed)
[facility] Emergency Department Provider Note   ____________________________________________   First MD Initiated Contact with Patient 09/25/20 [phone]     (approximate)  I have reviewed the triage vital signs and the nursing notes.   HISTORY  Chief Complaint Psychiatric Evaluation Patient is not acting like herself   HPI Robin Wood is a 24 y.o. female who comes in with some altered mental status.  I spoke with her mom on the telephone.  Mom reports she had a Covid shot about a month ago which made her very sick she came in today and was not acting right.  Usually she is very talkative and active and alert but today she was kind of mumbling to herself and seemed like she was hearing voices.  Mom reports she has a history of schizophrenia and has done this 3 times in the past.  Mom gave her a dose of Tylenol PM to see if she could rest but she did not and she was just not acting like herself so they brought her here.  Here patient appears to be very scared and fearful and cannot follow commands.  She cannot do finger-to-nose for example she can answer some questions and she can tell me that she got the Covid shot a few weeks ago she can tell me she is not having a headache she can bend her neck down to her chest without any difficulty.  She cannot tell me anything else.  She cannot tell me if she is having voices.  And cannot tell me if anything else is bothering her.  She does have a low-grade fever of 100.6 and she is somewhat tachycardic to 110.  Because of her altered mental status low-grade fever and inability to follow all commands and the history of recent Covid vaccination I will get a CT scan with contrast to make sure there is no cerebral venous thrombosis or any other problem.         Past Medical History:  Diagnosis Date  . Anxiety   . Insomnia     Patient Active Problem List   Diagnosis Date Noted  . Malpresentation of fetus, antepartum  07/05/2018  . Chlamydia infection affecting pregnancy in third trimester, antepartum 07/05/2018  . Trichomonas vaginitis 07/05/2018  . Labor and delivery, indication for care 07/03/2018  . Indication for care in labor and delivery, antepartum 04/16/2018    Past Surgical History:  Procedure Laterality Date  . CESAREAN SECTION N/A 07/05/2018   Procedure: CESAREAN SECTION;  Surgeon: Conard Novak, MD;  Location: [facility] ORS;  Service: Obstetrics;  Laterality: N/A;  . NO PAST SURGERIES      Prior to Admission medications   Medication Sig Start Date End Date Taking? Authorizing Provider  ferrous sulfate 325 (65 FE) MG tablet Take 1 tablet (325 mg total) by mouth 2 (two) times daily with a meal. 07/08/18   Schuman, Christanna R, MD  ibuprofen (ADVIL,MOTRIN) 600 MG tablet Take 1 tablet (600 mg total) by mouth every 6 (six) hours. 07/08/18   Schuman, Jaquelyn Bitter, MD  medroxyPROGESTERone (DEPO-PROVERA) 150 MG/ML injection Inject 1 mL (150 mg total) into the muscle every 3 (three) months. 07/08/18   Schuman, Jaquelyn Bitter, MD  NIFEdipine (PROCARDIA-XL/ADALAT CC) 30 MG 24 hr tablet Take 1 tablet (30 mg total) by mouth daily. 07/08/18   Schuman, Jaquelyn Bitter, MD  oxyCODONE-acetaminophen (PERCOCET/ROXICET) 5-325 MG tablet Take 1 tablet by mouth every 4 (four) hours as needed for moderate pain (pain score  4-7/10). 07/08/18   Schuman, Jaquelyn Bitterhristanna R, MD  Prenatal Vit-Fe Fumarate-FA (PRENATAL MULTIVITAMIN) TABS tablet Take 1 tablet by mouth daily at 12 noon.    [provider]    Allergies Penicillins  No family history on file.  Social History Social History   Tobacco Use  . Smoking status: Current Some Day Smoker    Types: Cigarettes    Last attempt to quit: 12/17/2017    Years since quitting: 2.7  . Smokeless tobacco: Never Used  Vaping Use  . Vaping Use: Every day  Substance Use Topics  . Alcohol use: Yes    Comment: "not frequently"  . Drug use: No    Review of Systems  Unable  to obtain ____________________________________________   PHYSICAL EXAM:  VITAL SIGNS: ED Triage Vitals  Enc Vitals Group     BP 09/25/20 1845 (!) 129/99     Pulse Rate 09/25/20 1845 (!) 110     Resp 09/25/20 1845 16     Temp 09/25/20 1845 (!) 100.6 F (38.1 C)     Temp Source 09/25/20 1845 Oral     SpO2 09/25/20 1845 100 %     Weight 09/25/20 1845 90 lb (40.8 kg)     Height 09/25/20 1845 5\' 2"  (1.575 m)     Head Circumference --      Peak Flow --      Pain Score 09/25/20 1853 0     Pain Loc --      Pain Edu? --      Excl. in GC? --     Constitutional: Alert but confused Eyes: Conjunctivae are normal. PER. EOMI. Head: Atraumatic. Nose: No congestion/rhinnorhea. Mouth/Throat: Mucous membranes are moist.  Oropharynx non-erythematous. Neck: No stridor.  No cervical spine tenderness to palpation. Hematological/Lymphatic/Immunilogical: No cervical lymphadenopathy. Cardiovascular: Normal rate, regular rhythm. Grossly normal heart sounds.  Good peripheral circulation. Respiratory: Normal respiratory effort.  No retractions. Lungs CTAB. Gastrointestinal: Soft and nontender. No distention. No abdominal bruits. No CVA tenderness. Musculoskeletal: No lower extremity tenderness nor edema.   Neurologic: Patient not speaking very loudly and not answering most questions.  She does appear to move all extremities equally and well but cannot follow commands reliably.   ____________________________________________   LABS (all labs ordered are listed, but only abnormal results are displayed)  Labs Reviewed  COMPREHENSIVE METABOLIC PANEL - Abnormal; Notable for the following components:      Result Value   Potassium 3.2 (*)    Glucose, Bld 116 (*)    All other components within normal limits  SALICYLATE LEVEL - Abnormal; Notable for the following components:   Salicylate Lvl <7.0 (*)    All other components within normal limits  CBC - Abnormal; Notable for the following components:    MCH 25.4 (*)    RDW 16.3 (*)    All other components within normal limits  URINE DRUG SCREEN, QUALITATIVE ([facility] ONLY) - Abnormal; Notable for the following components:   Cocaine Metabolite,Ur [place] POSITIVE (*)    Cannabinoid 50 Ng, Ur [place] POSITIVE (*)    All other components within normal limits  URINALYSIS, COMPLETE (UACMP) WITH MICROSCOPIC - Abnormal; Notable for the following components:   Color, Urine YELLOW (*)    APPearance HAZY (*)    Specific Gravity, Urine >1.046 (*)    All other components within normal limits  SEDIMENTATION RATE - Abnormal; Notable for the following components:   Sed Rate 21 (*)    All other components within normal limits  CBC WITH DIFFERENTIAL/PLATELET - Abnormal; Notable for the following components:   MCH 25.1 (*)    RDW 16.5 (*)    All other components within normal limits  RESPIRATORY PANEL BY RT PCR (FLU A&B, COVID)  ETHANOL  ACETAMINOPHEN LEVEL  POC URINE PREG, ED   ____________________________________________  EKG   ____________________________________________  RADIOLOGY Jill Poling, personally viewed and evaluated these images (plain radiographs) as part of my medical decision making, as well as reviewing the written report by the radiologist.  ED MD interpretation:    Official radiology report(s): DG Chest 1 View  Result Date: 09/25/2020 CLINICAL DATA:  Altered mental status EXAM: CHEST  1 VIEW COMPARISON:  03/18/2019 FINDINGS: Heart and mediastinal contours are within normal limits. No focal opacities or effusions. No acute bony abnormality. Contrast material seen within the collecting systems within the kidneys. IMPRESSION: No active cardiopulmonary disease. Electronically Signed   By: Charlett Nose M.D.   On: 09/25/2020 22:00   CT Head W or Wo Contrast  Result Date: 09/25/2020 CLINICAL DATA:  Initial evaluation for acute mental status change, recent COVID vaccination. EXAM: CT HEAD WITHOUT AND WITH CONTRAST TECHNIQUE:  Contiguous axial images were obtained from the base of the skull through the vertex without and with intravenous contrast CONTRAST:  34mL OMNIPAQUE IOHEXOL 300 MG/ML  SOLN COMPARISON:  None. FINDINGS: Brain: Examination degraded by motion artifact. Cerebral volume within normal limits. No acute intracranial hemorrhage. No acute large vessel territory infarct. No mass lesion, midline shift or mass effect. No hydrocephalus or visible extra-axial fluid collection. No abnormal enhancement following contrast administration. Vascular: No hyperdense vessel prior to contrast administration. Normal enhancement seen throughout the brain following contrast administration. Major dural sinuses are grossly patent on this motion degraded exam. Skull: Scalp soft tissues within normal limits.  Calvarium intact. Sinuses/Orbits: Visualized globes and orbital soft tissues within normal limits. Visualized paranasal sinuses are clear. No mastoid effusion. Other: None. IMPRESSION: 1. Negative head CT with and without contrast. No acute intracranial abnormality identified. No visible dural sinus thrombosis. 2. Motion degraded exam. Electronically Signed   By: Rise Mu M.D.   On: 09/25/2020 22:00    ____________________________________________   PROCEDURES  Procedure(s) performed (including Critical Care):  Procedures review of her old records does not show any evidence of schizophrenia in the past.   ____________________________________________   INITIAL IMPRESSION / ASSESSMENT AND PLAN / ED COURSE  I also spoke to Ms. Vanwey's fianc.  She reports he reports she has been acting like this for 3 days.  She is not had any complaints of headache bellyache muscle aches or anything else just answering him in one-word statements which is very unusual for her. Here patient appears to be responding occasionally to internal stimuli making moaning whining noises etc. she has a low-grade fever but everything appears to  be normal.  Sed rate is 21 normal is 20.  Chest x-ray and urine are clear.  White count is normal.  Covid test is still pending.  Discussed I have discussed the patient with her mom over the course of 3 different phone calls.  Her mom says she is done similar things 3 times previously and has been diagnosed as having schizophrenia.  Because of this I will keep her here in the hospital and under IVC.  She does not appear to have any significant infection.  She does appear to be somewhat dehydrated and her tox screen is positive for cocaine and marijuana.  ____________________________________________   FINAL CLINICAL IMPRESSION(S) / ED DIAGNOSES  Final diagnoses:  Psychosis, unspecified psychosis type [facility])     ED Discharge Orders    None      *Please note:  Robin Wood was evaluated in Emergency Department on 09/25/2020 for the symptoms described in the history of present illness. She was evaluated in the context of the global COVID-19 pandemic, which necessitated consideration that the patient might be at risk for infection with the SARS-CoV-2 virus that causes COVID-19. Institutional protocols and algorithms that pertain to the evaluation of patients at risk for COVID-19 are in a state of rapid change based on information released by regulatory bodies including the CDC and federal and state organizations. These policies and algorithms were followed during the patient's care in the ED.  Some ED evaluations and interventions may be delayed as a result of limited staffing during and the pandemic.*   Note:  This document was prepared using Dragon voice recognition software and may include unintentional dictation errors.    Arnaldo Natal, MD 09/25/20 2348

## 2020-09-25 NOTE — ED Notes (Signed)
Pt dressed out into scrubs. Belongings bagged at quad nurse's station with pt label  Belongings: 1 brownish dress 1 pair flip flops (slides)

## 2020-09-25 NOTE — ED Notes (Signed)
Pt looks visibly uncomfortable. Pt denies nausea, pain, needs otherwise. Pt given warm blanket.

## 2020-09-25 NOTE — ED Notes (Signed)
Pt shakes intermittently, and has tremulous episodes. Pt jerks self, shoots upright, and moans/whines. Pt slow to respond, but denies complaints

## 2020-09-25 NOTE — ED Notes (Signed)
Pt reports normally taking tylenol PM at home to sleep, is requesting some here. EDP notified, orders received.

## 2020-09-25 NOTE — ED Notes (Signed)
Pt gives verbal consent to update [employer] (fiance) on care. Fiance updated by EDP.

## 2020-09-25 NOTE — ED Notes (Signed)
Pt noted to startle easily. Occasionally makes moaning/whining noises. When asked, pt denies complaints/needs. Pt occasionally trembles/shakes, mostly in arms/hands

## 2020-09-25 NOTE — ED Notes (Signed)
Per pt mother, this is her fourth episode of similar behavior recently

## 2020-09-26 ENCOUNTER — Encounter: Payer: Self-pay | Admitting: Psychiatry

## 2020-09-26 ENCOUNTER — Inpatient Hospital Stay
Admission: AD | Admit: 2020-09-26 | Discharge: 2020-09-30 | DRG: 885 | Disposition: A | Discharge status: Home / Self Care | Payer: Medicaid Other | Source: Intra-hospital | Attending: Behavioral Health | Admitting: Behavioral Health

## 2020-09-26 DIAGNOSIS — Z20822 Contact with and (suspected) exposure to covid-19: Secondary | ICD-10-CM | POA: Diagnosis present

## 2020-09-26 DIAGNOSIS — F17210 Nicotine dependence, cigarettes, uncomplicated: Secondary | ICD-10-CM | POA: Diagnosis present

## 2020-09-26 DIAGNOSIS — E569 Vitamin deficiency, unspecified: Secondary | ICD-10-CM | POA: Diagnosis present

## 2020-09-26 DIAGNOSIS — Z793 Long term (current) use of hormonal contraceptives: Secondary | ICD-10-CM | POA: Diagnosis not present

## 2020-09-26 DIAGNOSIS — Z818 Family history of other mental and behavioral disorders: Secondary | ICD-10-CM

## 2020-09-26 DIAGNOSIS — F809 Developmental disorder of speech and language, unspecified: Secondary | ICD-10-CM | POA: Diagnosis present

## 2020-09-26 DIAGNOSIS — F333 Major depressive disorder, recurrent, severe with psychotic symptoms: Principal | ICD-10-CM | POA: Diagnosis present

## 2020-09-26 DIAGNOSIS — E611 Iron deficiency: Secondary | ICD-10-CM | POA: Diagnosis present

## 2020-09-26 DIAGNOSIS — F1410 Cocaine abuse, uncomplicated: Secondary | ICD-10-CM

## 2020-09-26 DIAGNOSIS — Z880 Allergy status to penicillin: Secondary | ICD-10-CM | POA: Diagnosis not present

## 2020-09-26 DIAGNOSIS — Z79899 Other long term (current) drug therapy: Secondary | ICD-10-CM | POA: Diagnosis not present

## 2020-09-26 DIAGNOSIS — F29 Unspecified psychosis not due to a substance or known physiological condition: Secondary | ICD-10-CM

## 2020-09-26 DIAGNOSIS — I10 Essential (primary) hypertension: Secondary | ICD-10-CM | POA: Diagnosis present

## 2020-09-26 DIAGNOSIS — F419 Anxiety disorder, unspecified: Secondary | ICD-10-CM | POA: Diagnosis present

## 2020-09-26 DIAGNOSIS — G4700 Insomnia, unspecified: Secondary | ICD-10-CM | POA: Diagnosis present

## 2020-09-26 LAB — LIPID PANEL
Cholesterol: 152 mg/dL (ref 0–200)
HDL: 50 mg/dL (ref >40)
LDL Cholesterol: 93 mg/dL (ref 0–99)
Total CHOL/HDL Ratio: 3 RATIO
Triglycerides: 46 mg/dL (ref <150)
VLDL: 9 mg/dL (ref 0–40)

## 2020-09-26 LAB — PREGNANCY, URINE: Preg Test, Ur: NEGATIVE

## 2020-09-26 LAB — RESPIRATORY PANEL BY RT PCR (FLU A&B, COVID)
Influenza A by PCR: NEGATIVE
Influenza B by PCR: NEGATIVE
SARS Coronavirus 2 by RT PCR: NEGATIVE

## 2020-09-26 MED ORDER — HYDROXYZINE HCL 50 MG PO TABS
50.0000 mg | ORAL_TABLET | Freq: Three times a day (TID) | ORAL | Status: DC | PRN
  Administered 2020-09-30: 50 mg via ORAL
  Filled 2020-09-26: qty 1

## 2020-09-26 MED ORDER — RISPERIDONE 1 MG PO TABS
0.5000 mg | ORAL_TABLET | Freq: Every day | ORAL | Status: DC
  Administered 2020-09-26: 0.5 mg via ORAL
  Filled 2020-09-26: qty 1

## 2020-09-26 MED ORDER — TRAZODONE HCL 100 MG PO TABS
100.0000 mg | ORAL_TABLET | Freq: Every evening | ORAL | Status: DC | PRN
  Administered 2020-09-26 – 2020-09-29 (×4): 100 mg via ORAL
  Filled 2020-09-26 (×4): qty 1

## 2020-09-26 MED ORDER — MAGNESIUM HYDROXIDE 400 MG/5ML PO SUSP: 30.0000 mL | Freq: Every day | ORAL | Status: DC | PRN

## 2020-09-26 MED ORDER — LORAZEPAM 2 MG PO TABS
2.0000 mg | ORAL_TABLET | Freq: Four times a day (QID) | ORAL | Status: DC | PRN
  Administered 2020-09-26 – 2020-09-29 (×6): 2 mg via ORAL
  Filled 2020-09-26 (×6): qty 1

## 2020-09-26 MED ORDER — ACETAMINOPHEN 325 MG PO TABS: 650.0000 mg | ORAL_TABLET | Freq: Four times a day (QID) | ORAL | Status: DC | PRN

## 2020-09-26 MED ORDER — ALUM & MAG HYDROXIDE-SIMETH 200-200-20 MG/5ML PO SUSP: 30.0000 mL | ORAL | Status: DC | PRN

## 2020-09-26 MED ORDER — NIFEDIPINE ER OSMOTIC RELEASE 30 MG PO TB24
30.0000 mg | ORAL_TABLET | Freq: Every day | ORAL | Status: DC
  Filled 2020-09-26 (×6): qty 1

## 2020-09-26 NOTE — ED Notes (Signed)
Report received from Noah RN

## 2020-09-26 NOTE — ED Notes (Addendum)
Pt resting in recliner, sitter at bedside

## 2020-09-26 NOTE — Consult Note (Signed)
[facility]   Reason for Consult:   Consult for this 24 year old woman brought into the hospital by her mother because of acute mental status changes Referring Physician: Juliette AlcideMelinda Patient Identification: Robin Wood MRN:  [id number] Principal Diagnosis: Psychosis [facility](HCC) Diagnosis:  Principal Problem:   Psychosis (HCC) Active Problems:   Cocaine abuse (HCC)   Total Time spent with patient: 1 hour  Subjective:   Robin Wood is a 24 y.o. female patient admitted with patient not communicative.  HPI: Patient seen chart reviewed.  I also spoke with the patient's mother.  24 year old was brought to the emergency room by her family because of acute mental status changes.  On interview the patient is withdrawn and curled up in a recliner wrapped up in a blanket.  Tears on her face frightened affect.  When questioned she answers questions with very quiet whispers most of which I either cannot understand or do not make much sense.  She tells me her mother brought her here because she was not talking.  Patient not able to give me any other information right now.  Mother says that she feels the patient is having a nervous breakdown.  She says it is been a couple days that she seemed to be very different in her behavior not communicating not taking care of her self not responding to family.  They do not know of any specific stressor at this time.  Patient's drug screen is positive for cocaine and cannabis.  Past Psychiatric History: According to the mother the patient has had previous hospitalizations at [facility] and at another hospital.  She suspects the patient may have schizophrenia but does not know for certain.  No known history of suicide attempts or violence  Risk to Self:   Risk to Others:   Prior Inpatient Therapy:   Prior Outpatient Therapy:    Past Medical History:  Past Medical History:  Diagnosis Date  . Anxiety   . Insomnia     Past Surgical History:   Procedure Laterality Date  . CESAREAN SECTION N/A 07/05/2018   Procedure: CESAREAN SECTION;  Surgeon: Conard NovakJackson, Stephen D, MD;  Location: [facility] ORS;  Service: Obstetrics;  Laterality: N/A;  . NO PAST SURGERIES     Family History: No family history on file. Family Psychiatric  History: Mother reports that both sides of the family have had people with mental health problems.  Patient's father reportedly has schizophrenia Social History:  Social History   Substance and Sexual Activity  Alcohol Use Yes   Comment: "not frequently"     Social History   Substance and Sexual Activity  Drug Use No    Social History   Socioeconomic History  . Marital status: Single    Spouse name: Not on file  . Number of children: 0  . Years of education: Not on file  . Highest education level: 11th grade  Occupational History  . Not on file  Tobacco Use  . Smoking status: Current Some Day Smoker    Types: Cigarettes    Last attempt to quit: 12/17/2017    Years since quitting: 2.7  . Smokeless tobacco: Never Used  Vaping Use  . Vaping Use: Every day  Substance and Sexual Activity  . Alcohol use: Yes    Comment: "not frequently"  . Drug use: No  . Sexual activity: Yes    Birth control/protection: Injection    Comment: Depo  Other Topics Concern  . Not on file  Social History  Narrative  . Not on file   Social Determinants of Health   Financial Resource Strain:   . Difficulty of Paying Living Expenses: Not on file  Food Insecurity:   . Worried About Programme researcher, broadcasting/film/video in the Last Year: Not on file  . Ran Out of Food in the Last Year: Not on file  Transportation Needs:   . Lack of Transportation (Medical): Not on file  . Lack of Transportation (Non-Medical): Not on file  Physical Activity:   . Days of Exercise per Week: Not on file  . Minutes of Exercise per Session: Not on file  Stress:   . Feeling of Stress : Not on file  Social Connections:   . Frequency of Communication with  Friends and Family: Not on file  . Frequency of Social Gatherings with Friends and Family: Not on file  . Attends Religious Services: Not on file  . Active Member of Clubs or Organizations: Not on file  . Attends Banker Meetings: Not on file  . Marital Status: Not on file   Additional Social History:    Allergies:   Allergies  Allergen Reactions  . Penicillins Hives    Labs:  Results for orders placed or performed during the hospital encounter of 09/25/20 (from the past 48 hour(s))  Comprehensive metabolic panel     Status: Abnormal   Collection Time: 09/25/20  6:59 PM  Result Value Ref Range   Sodium 138 135 - 145 mmol/L   Potassium 3.2 (L) 3.5 - 5.1 mmol/L   Chloride 105 98 - 111 mmol/L   CO2 25 22 - 32 mmol/L   Glucose, Bld 116 (H) 70 - 99 mg/dL    Comment: Glucose reference range applies only to samples taken after fasting for at least 8 hours.   BUN 7 6 - 20 mg/dL   Creatinine, Ser 4.23 0.44 - 1.00 mg/dL   Calcium 9.1 8.9 - 53.6 mg/dL   Total Protein 8.0 6.5 - 8.1 g/dL   Albumin 4.3 3.5 - 5.0 g/dL   AST 18 15 - 41 U/L   ALT 13 0 - 44 U/L   Alkaline Phosphatase 62 38 - 126 U/L   Total Bilirubin 0.8 0.3 - 1.2 mg/dL   GFR, Estimated >14 >43 mL/min    Comment: (NOTE) Calculated using the CKD-EPI Creatinine Equation (2021)    Anion gap 8 5 - 15    Comment: Performed at [facility], [address] [place], [place] [address]  Ethanol     Status: None   Collection Time: 09/25/20  6:59 PM  Result Value Ref Range   Alcohol, Ethyl (B) <10 <10 mg/dL    Comment: (NOTE) Lowest detectable limit for serum alcohol is 10 mg/dL.  For medical purposes only. Performed at [facility], [address] [place], [place] [address]   Salicylate level     Status: Abnormal   Collection Time: 09/25/20  6:59 PM  Result Value Ref Range   Salicylate Lvl <7.0 (L) 7.0 - 30.0 mg/dL    Comment: Performed at [facility], [address]  [place], [place] [address]  Acetaminophen level     Status: None   Collection Time: 09/25/20  6:59 PM  Result Value Ref Range   Acetaminophen (Tylenol), Serum 21 10 - 30 ug/mL    Comment: (NOTE) Therapeutic concentrations vary significantly. A range of 10-30 ug/mL  may be an effective concentration for many patients. However, some  are best  treated at concentrations outside of this range. Acetaminophen concentrations >150 ug/mL at 4 hours after ingestion  and >50 ug/mL at 12 hours after ingestion are often associated with  toxic reactions.  Performed at [facility], [address] [place], [place] [address]   cbc     Status: Abnormal   Collection Time: 09/25/20  6:59 PM  Result Value Ref Range   WBC 4.1 4.0 - 10.5 K/uL   RBC 4.73 3.87 - 5.11 MIL/uL   Hemoglobin 12.0 12.0 - 15.0 g/dL   HCT 67.8 36 - 46 %   MCV 81.0 80.0 - 100.0 fL   MCH 25.4 (L) 26.0 - 34.0 pg   MCHC 31.3 30.0 - 36.0 g/dL   RDW 93.8 (H) 10.1 - 75.1 %   Platelets 234 150 - 400 K/uL   nRBC 0.0 0.0 - 0.2 %    Comment: Performed at [facility], [address] [place], [place] [address]  Sedimentation rate     Status: Abnormal   Collection Time: 09/25/20  6:59 PM  Result Value Ref Range   Sed Rate 21 (H) 0 - 20 mm/hr    Comment: Performed at [facility], [address] [place], [place] [address]  CBC with Differential/Platelet     Status: Abnormal   Collection Time: 09/25/20  6:59 PM  Result Value Ref Range   WBC 4.2 4.0 - 10.5 K/uL   RBC 4.86 3.87 - 5.11 MIL/uL   Hemoglobin 12.2 12.0 - 15.0 g/dL   HCT 42.3 36 - 46 %   MCV 80.0 80.0 - 100.0 fL   MCH 25.1 (L) 26.0 - 34.0 pg   MCHC 31.4 30.0 - 36.0 g/dL   RDW 53.6 (H) 14.4 - 31.5 %   Platelets 243 150 - 400 K/uL   nRBC 0.0 0.0 - 0.2 %   Neutrophils Relative % 63 %   Neutro Abs 2.6 1.7 - 7.7 K/uL   Lymphocytes Relative 28 %   Lymphs Abs 1.2 0.7 - 4.0 K/uL   Monocytes Relative 9 %   Monocytes Absolute 0.4 0.1 - 1.0 K/uL    Eosinophils Relative 0 %   Eosinophils Absolute 0.0 0.0 - 0.5 K/uL   Basophils Relative 0 %   Basophils Absolute 0.0 0.0 - 0.1 K/uL   Immature Granulocytes 0 %   Abs Immature Granulocytes 0.01 0.00 - 0.07 K/uL    Comment: Performed at [facility], [address] [place], [place] [address]  Urine Drug Screen, Qualitative     Status: Abnormal   Collection Time: 09/25/20  9:48 PM  Result Value Ref Range   Tricyclic, Ur Screen NONE DETECTED NONE DETECTED   Amphetamines, Ur Screen NONE DETECTED NONE DETECTED   MDMA (Ecstasy)Ur Screen NONE DETECTED NONE DETECTED   Cocaine Metabolite,Ur [place] POSITIVE (A) NONE DETECTED   Opiate, Ur Screen NONE DETECTED NONE DETECTED   Phencyclidine (PCP) Ur S NONE DETECTED NONE DETECTED   Cannabinoid 50 Ng, Ur [place] POSITIVE (A) NONE DETECTED   Barbiturates, Ur Screen NONE DETECTED NONE DETECTED   Benzodiazepine, Ur Scrn NONE DETECTED NONE DETECTED   Methadone Scn, Ur NONE DETECTED NONE DETECTED    Comment: (NOTE) Tricyclics + metabolites, urine    Cutoff 1000 ng/mL Amphetamines + metabolites, urine  Cutoff 1000 ng/mL MDMA (Ecstasy), urine              Cutoff 500 ng/mL Cocaine Metabolite, urine          Cutoff 300  ng/mL Opiate + metabolites, urine        Cutoff 300 ng/mL Phencyclidine (PCP), urine         Cutoff 25 ng/mL Cannabinoid, urine                 Cutoff 50 ng/mL Barbiturates + metabolites, urine  Cutoff 200 ng/mL Benzodiazepine, urine              Cutoff 200 ng/mL Methadone, urine                   Cutoff 300 ng/mL  The urine drug screen provides only a preliminary, unconfirmed analytical test result and should not be used for non-medical purposes. Clinical consideration and professional judgment should be applied to any positive drug screen result due to possible interfering substances. A more specific alternate chemical method must be used in order to obtain a confirmed analytical result. Gas chromatography / mass spectrometry (GC/MS)  is the preferred confirm atory method. Performed at [facility], [address] [place], [place] [address]   Urinalysis, Complete w Microscopic Urine, Clean Catch     Status: Abnormal   Collection Time: 09/25/20  9:48 PM  Result Value Ref Range   Color, Urine YELLOW (A) YELLOW   APPearance HAZY (A) CLEAR   Specific Gravity, Urine >1.046 (H) 1.005 - 1.030   pH 6.0 5.0 - 8.0   Glucose, UA NEGATIVE NEGATIVE mg/dL   Hgb urine dipstick NEGATIVE NEGATIVE   Bilirubin Urine NEGATIVE NEGATIVE   Ketones, ur NEGATIVE NEGATIVE mg/dL   Protein, ur NEGATIVE NEGATIVE mg/dL   Nitrite NEGATIVE NEGATIVE   Leukocytes,Ua NEGATIVE NEGATIVE   RBC / HPF 0-5 0 - 5 RBC/hpf   WBC, UA 0-5 0 - 5 WBC/hpf   Bacteria, UA NONE SEEN NONE SEEN   Squamous Epithelial / LPF 0-5 0 - 5   Mucus PRESENT     Comment: Performed at [facility], [address] [place], [place] [address]  Pregnancy, urine     Status: None   Collection Time: 09/25/20  9:48 PM  Result Value Ref Range   Preg Test, Ur NEGATIVE NEGATIVE    Comment: Performed at [facility], [address], [place], [place] [address]  Respiratory Panel by RT PCR (Flu A&B, Covid) - Nasopharyngeal Swab     Status: None   Collection Time: 09/25/20 11:03 PM   Specimen: Nasopharyngeal Swab  Result Value Ref Range   SARS Coronavirus 2 by RT PCR NEGATIVE NEGATIVE    Comment: (NOTE) SARS-CoV-2 target nucleic acids are NOT DETECTED.  The SARS-CoV-2 RNA is generally detectable in upper respiratoy specimens during the acute phase of infection. The lowest concentration of SARS-CoV-2 viral copies this assay can detect is 131 copies/mL. A negative result does not preclude SARS-Cov-2 infection and should not be used as the sole basis for treatment or other patient management decisions. A negative result may occur with  improper specimen collection/handling, submission of specimen other than nasopharyngeal swab, presence of viral  mutation(s) within the areas targeted by this assay, and inadequate number of viral copies (<131 copies/mL). A negative result must be combined with clinical observations, patient history, and epidemiological information. The expected result is Negative.  Fact Sheet for Patients:  https://www.moore.com/  Fact Sheet for Healthcare Providers:  https://www.young.biz/  This test is no t yet approved or cleared by the [place] FDA and  has been authorized for detection and/or diagnosis of SARS-CoV-2 by FDA under an Emergency  Use Authorization (EUA). This EUA will remain  in effect (meaning this test can be used) for the duration of the COVID-19 declaration under Section 564(b)(1) of the Act, 21 U.S.C. section 360bbb-3(b)(1), unless the authorization is terminated or revoked sooner.     Influenza A by PCR NEGATIVE NEGATIVE   Influenza B by PCR NEGATIVE NEGATIVE    Comment: (NOTE) The Xpert Xpress SARS-CoV-2/FLU/RSV assay is intended as an aid in  the diagnosis of influenza from Nasopharyngeal swab specimens and  should not be used as a sole basis for treatment. Nasal washings and  aspirates are unacceptable for Xpert Xpress SARS-CoV-2/FLU/RSV  testing.  Fact Sheet for Patients: https://www.moore.com/  Fact Sheet for Healthcare Providers: https://www.young.biz/  This test is not yet approved or cleared by the [place] FDA and  has been authorized for detection and/or diagnosis of SARS-CoV-2 by  FDA under an Emergency Use Authorization (EUA). This EUA will remain  in effect (meaning this test can be used) for the duration of the  Covid-19 declaration under Section 564(b)(1) of the Act, 21  U.S.C. section 360bbb-3(b)(1), unless the authorization is  terminated or revoked. Performed at [facility], [address] [place], [place] [address]     No current facility-administered  medications for this encounter.   Current Outpatient Medications  Medication Sig Dispense Refill  . ferrous sulfate 325 (65 FE) MG tablet Take 1 tablet (325 mg total) by mouth 2 (two) times daily with a meal. 60 tablet 11  . ibuprofen (ADVIL,MOTRIN) 600 MG tablet Take 1 tablet (600 mg total) by mouth every 6 (six) hours. 60 tablet 3  . medroxyPROGESTERone (DEPO-PROVERA) 150 MG/ML injection Inject 1 mL (150 mg total) into the muscle every 3 (three) months. 1 mL 3  . NIFEdipine (PROCARDIA-XL/ADALAT CC) 30 MG 24 hr tablet Take 1 tablet (30 mg total) by mouth daily. 30 tablet 1  . oxyCODONE-acetaminophen (PERCOCET/ROXICET) 5-325 MG tablet Take 1 tablet by mouth every 4 (four) hours as needed for moderate pain (pain score 4-7/10). 20 tablet 0  . Prenatal Vit-Fe Fumarate-FA (PRENATAL MULTIVITAMIN) TABS tablet Take 1 tablet by mouth daily at 12 noon.      Musculoskeletal: Strength & Muscle Tone: within normal limits Gait & Station: normal Patient leans: N/A  Psychiatric Specialty Exam: Physical Exam Vitals and nursing note reviewed.  Constitutional:      Appearance: She is well-developed.  HENT:     Head: Normocephalic and atraumatic.  Eyes:     Conjunctiva/sclera: Conjunctivae normal.     Pupils: Pupils are equal, round, and reactive to light.  Cardiovascular:     Heart sounds: Normal heart sounds.  Pulmonary:     Effort: Pulmonary effort is normal.  Abdominal:     Palpations: Abdomen is soft.  Musculoskeletal:        General: Normal range of motion.     Cervical back: Normal range of motion.  Skin:    General: Skin is warm and dry.  Neurological:     General: No focal deficit present.     Mental Status: She is alert.  Psychiatric:        Attention and Perception: She is inattentive.        Mood and Affect: Mood is anxious. Affect is tearful.        Speech: She is noncommunicative.        Behavior: Behavior is withdrawn.        Cognition and Memory: Cognition is impaired.  Review of Systems  Unable to perform ROS: Psychiatric disorder    Blood pressure 108/70, pulse (!) 53, temperature 98.7 F (37.1 C), temperature source Oral, resp. rate 14, height  (1.575 m), weight 40.8 kg, SpO2 99 %, unknown if currently breastfeeding.Body mass index is 16.46 kg/m.  General Appearance: Casual  Eye Contact:  Minimal  Speech:  Slow  Volume:  Decreased  Mood:  Dysphoric  Affect:  Constricted, Depressed and Tearful  Thought Process:  Disorganized  Orientation:  Negative  Thought Content:  Negative  Suicidal Thoughts:  No  Homicidal Thoughts:  No  Memory:  Negative  Judgement:  Negative  Insight:  Negative  Psychomotor Activity:  Negative  Concentration:  Concentration: Negative  Recall:  Negative  Fund of Knowledge:  Negative  Language:  Negative  Akathisia:  No  Handed:  Right  AIMS (if indicated):     Assets:  Desire for Improvement Social Support  ADL's:  Impaired  Cognition:  Impaired,  Mild  Sleep:        Treatment Plan Summary: Plan Patient extremely withdrawn.  Unable to interact appropriately.  Family reports not able to care for herself adequately.  Patient at this point meets commitment criteria based on apparent psychosis.  Could be related to drug abuse or could be 2 separate problems.  Case reviewed with emergency room physician and TTS.  Plan for admission to the psychiatric ward for further evaluation and treatment.  Disposition: Recommend psychiatric Inpatient admission when medically cleared.  Mordecai Rasmussen, MD 09/26/2020 2:04 PM

## 2020-09-26 NOTE — BH Assessment (Signed)
Per Dr. Toni Amend pt meets criteria for INPT (Psychosis)  TTS will explore bed availability at [facility] and [facility]. If there are no beds available at either [facility] or [facility], pt will be referred out accordingly.

## 2020-09-26 NOTE — ED Notes (Signed)
Pt to behavioral med; belly button ring removed and added to belongings bag.

## 2020-09-26 NOTE — ED Notes (Signed)
Pt requested LEO tell her why she is here. This nurse attempted to speak with her regarding why she is here, but she just furrowed her brow and shook her head. Pt unable or unwilling to answer questions.

## 2020-09-26 NOTE — BH Assessment (Signed)
Patient can come down at 3:30pm  Call to give report: [phone]  Patient is to be admitted to [facility] by Dr. Neale Burly.  Attending Physician will be. Dr. Neale Burly.   Patient has been assigned to room 319, by [facility] Charge Nurse Maryelizabeth Kaufmann, RN.   Intake Paper Work has been signed and placed on patient chart.   ER staff is aware of the admission: 1. Nitchia, ER Secretary  2. Katrinka Blazing, ER MD  3. [facility] Patient's Nurse  4. [facility] Patient Access.

## 2020-09-26 NOTE — Progress Notes (Signed)
Admission Note:   Pt admitted to [facility]/BMU unit from psych ED, report received from [facility], [place]. Pt arrived calm, cooperative, internally preoccupied, thought blocking, selectively mute, poor eye contact, appears to be responding to internal stimuli, staring inappropriately, mumbles self talk softly, unable to understand what is said, affect blunted, intermittently tearful, labile mood. Pt makes bizarre grunting noises and strange posturing is noted. Pt refused to answer many questions including about feelings of depression or anxiety, or hallucinations, but denies suicidal and homicidal ideation, denies history of suicide attempts. Pt reports a history a previous inpatient psychiatric hospitalization at [facility], reports she has been told she has Schizophrenia in the past, history of taking Risperidone to treat the symptoms, reports she is currently non-compliant with taking medications. Pt has poor insight and was asking why she is in the hospital. Per ED RN report pt had a bag of IV fluids, and Benadryl and Tylenol yesterday, has been paranoid and selectively mute in ED, no medical/surgical history known. Pt is underweight, has a  Poor to fair appetite, per ED pt ate small portions of breakfast and lunch today. Pt is visibly anxious and tearful but unable to report why or rate it when asked, was given Ativan PRN PO, emotional support provided, and assisted pt to call her mother. Pt's UDS in ED was positive for Cocaine and THC. Body check was completed, with charge RN as 2nd nurse present. Skin intact and WDL. Will continue to monitor pt per Q15 minute face check and monitor for safety and progress.

## 2020-09-27 DIAGNOSIS — F29 Unspecified psychosis not due to a substance or known physiological condition: Secondary | ICD-10-CM

## 2020-09-27 MED ORDER — RISPERIDONE 1 MG PO TABS
1.0000 mg | ORAL_TABLET | Freq: Every day | ORAL | Status: DC
  Administered 2020-09-27: 1 mg via ORAL
  Filled 2020-09-27: qty 1

## 2020-09-27 MED ORDER — SERTRALINE HCL 25 MG PO TABS
50.0000 mg | ORAL_TABLET | Freq: Every day | ORAL | Status: DC
  Administered 2020-09-27 – 2020-09-30 (×4): 50 mg via ORAL
  Filled 2020-09-27 (×4): qty 2
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
## 2020-09-27 NOTE — Progress Notes (Signed)
Patient has been isolative to herself and pacing the hall since start of the shift. She presents to the medication room and is apprehensive at first and has a lot of questions regarding her stay. States that she is not comfortable being here does not feel she will be able to get to sleep tonight in an unfamiliar place. She reports needing to get back to her little girl and wants to know what she can do to be allowed to leave.  She was receptive to taking medication to help calm her down and to aid her in being able to sleep. She tolerated medication without incident.  Patient denied SI  HI  AVH and pain at this encounter. She does endorse depression and anxiety stemming from her current situation of being away from family.  She agreed to take a shower, change clothes and try to get a good nights sleep. She remains safe on the unit at this time with 15 minute safety checks and was encouraged to contact staff with any concerns.    Cleo Butler-Nicholson, LPN

## 2020-09-27 NOTE — Progress Notes (Signed)
Recreation Therapy Notes   Date: 09/27/2020  Time: 9:30 am   Location: Craft room     Behavioral response: N/A   Intervention Topic: Goals   Discussion/Intervention: Patient did not attend group.   Clinical Observations/Feedback:  Patient did not attend group.   Robin Wood LRT/CTRS         [name] 09/27/2020 12:58 PM 

## 2020-09-27 NOTE — Progress Notes (Signed)
Recreation Therapy Notes  INPATIENT RECREATION THERAPY ASSESSMENT  Patient Details Name: Robin Wood MRN: [id number] DOB: [birth year] Today's Date: 09/27/2020       Information Obtained From: Patient  Able to Participate in Assessment/Interview: Yes  Patient Presentation: Responsive  Reason for Admission (Per Patient): Active Symptoms  Patient Stressors:    Coping Skills:   Other (Comment) (Eat ice,Snack)  Leisure Interests (2+):  Individual - TV, Music - Listen, Social - Family, Exercise - Walking Adriana Simas)  Frequency of Recreation/Participation: Weekly  Awareness of Community Resources:  Yes  Community Resources:  [place], [place], Engineer, petroleum  Current Use:    If no, Barriers?:    Expressed Interest in [employer] Information:    [place] of Residence:  Film/video editor  Patient Main Form of Transportation: Set designer  Patient Strengths:  Compassionate  Patient Identified Areas of Improvement:  Communication  Patient Goal for Hospitalization:  To get out of here  Current SI (including self-harm):  No  Current HI:  No  Current AVH: No  Staff Intervention Plan: Group Attendance, Collaborate with Interdisciplinary Treatment Team  Consent to Intern Participation: N/A  Dacy Enrico 09/27/2020, 3:58 PM

## 2020-09-27 NOTE — [facility] Group Notes (Signed)
LCSW Group Therapy Note  09/27/2020 2:41 PM  Type of Therapy/Topic:  Group Therapy:  Feelings about Diagnosis  Participation Level:  Did Not Attend   Description of Group:   This group will allow patients to explore their thoughts and feelings about diagnoses they have received. Patients will be guided to explore their level of understanding and acceptance of these diagnoses. Facilitator will encourage patients to process their thoughts and feelings about the reactions of others to their diagnosis and will guide patients in identifying ways to discuss their diagnosis with significant others in their lives. This group will be process-oriented, with patients participating in exploration of their own experiences, giving and receiving support, and processing challenge from other group members.   Therapeutic Goals: 1. Patient will demonstrate understanding of diagnosis as evidenced by identifying two or more symptoms of the disorder 2. Patient will be able to express two feelings regarding the diagnosis 3. Patient will demonstrate their ability to communicate their needs through discussion and/or role play  Summary of Patient Progress: X  Therapeutic Modalities:   Cognitive Behavioral Therapy Brief Therapy Feelings Identification   Lei Dower R. Algis Greenhouse, MSW, LCSW, LCAS 09/27/2020 2:41 PM

## 2020-09-27 NOTE — H&P (Signed)
Psychiatric Admission Assessment Adult  Patient Identification: Robin Wood MRN:  [id number] Date of Evaluation:  09/27/2020 Chief Complaint:  Psychosis [facility]) [[id number]] Principal Diagnosis: Psychosis (HCC) Diagnosis:  Principal Problem:   Psychosis (HCC) Active Problems:   Cocaine abuse (HCC)  History of Present Illness: Patient seen one-on-one today. She is shivering on exam stating she is very cold. She exhibits thought blocking, delayed speech, paranoia, and extreme anxiety. She is very tearful on exam. She states that she does not understand why she is here. She states she was simply annoyed with her fiance and stopped talking for 2 days. She notes that after leaving the hospital she stopped taking her medications. She feels she can control her own emotions simply by completing her tasks and speaking to her mother. She is very distressed that she is in the hospital away from her family, and requests that I check on her daughter. Attempted to call mother, Zella Ball, x3 but no answer and voicemail in [place]. Per chart review, ED team able to speak with mother who reports that the patient is having a nervous breakdown. She had not been communicating or caring for herself for two days, and not answering her family. Mother believes her daughter is diagnosed with schizophrenia, but is uncertain. Previously taking Zoloft and Risperdal without side effects.   Associated Signs/Symptoms: Depression Symptoms:  depressed mood, insomnia, hopelessness, impaired memory, weight loss, decreased appetite, Duration of Depression Symptoms: No data recorded (Hypo) Manic Symptoms:  Distractibility, Impulsivity, Irritable Mood, Anxiety Symptoms:  Excessive Worry, Panic Symptoms, Psychotic Symptoms:  Paranoia, Duration of Psychotic Symptoms: No data recorded PTSD Symptoms: Negative Total Time spent with patient: 45 minutes  Past Psychiatric History: According to the mother the patient has had previous  hospitalizations at [facility] and at another hospital.  She suspects the patient may have schizophrenia but does not know for certain.  No known history of suicide attempts or violence  Is the patient at risk to self? Yes.    Has the patient been a risk to self in the past 6 months? Yes.    Has the patient been a risk to self within the distant past? Yes.    Is the patient a risk to others? No.  Has the patient been a risk to others in the past 6 months? No.  Has the patient been a risk to others within the distant past? No.   Prior Inpatient Therapy:   Prior Outpatient Therapy:    Alcohol Screening: 1. How often do you have a drink containing alcohol?: Never 2. How many drinks containing alcohol do you have on a typical day when you are drinking?: 1 or 2 3. How often do you have six or more drinks on one occasion?: Never AUDIT-C Score: 0 4. How often during the last year have you found that you were not able to stop drinking once you had started?: Never 5. How often during the last year have you failed to do what was normally expected from you because of drinking?: Never 6. How often during the last year have you needed a first drink in the morning to get yourself going after a heavy drinking session?: Never 7. How often during the last year have you had a feeling of guilt of remorse after drinking?: Never 8. How often during the last year have you been unable to remember what happened the night before because you had been drinking?: Never 9. Have you or someone else been injured as a result of  your drinking?: No 10. Has a relative or friend or a doctor or another health worker been concerned about your drinking or suggested you cut down?: No Alcohol Use Disorder Identification Test Final Score (AUDIT): 0 Alcohol Brief Interventions/Follow-up: AUDIT Score <7 follow-up not indicated Substance Abuse History in the last 12 months:  Yes.   Consequences of Substance Abuse: Withdrawal  Symptoms:   Tremors Previous Psychotropic Medications: Yes  Psychological Evaluations: Yes  Past Medical History:  Past Medical History:  Diagnosis Date  . Anxiety   . Insomnia     Past Surgical History:  Procedure Laterality Date  . CESAREAN SECTION N/A 07/05/2018   Procedure: CESAREAN SECTION;  Surgeon: Conard Novak, MD;  Location: [facility] ORS;  Service: Obstetrics;  Laterality: N/A;  . NO PAST SURGERIES     Family History: History reviewed. No pertinent family history. Family Psychiatric  History: Mother reports that both sides of the family have had people with mental health problems.  Patient's father reportedly has schizophrenia Tobacco Screening:   Social History:  Social History   Substance and Sexual Activity  Alcohol Use Yes   Comment: "not frequently"     Social History   Substance and Sexual Activity  Drug Use No    Additional Social History: Marital status: Single Does patient have children?: Yes How many children?: 1 How is patient's relationship with their children?: "she's the best person in the world"                         Allergies:   Allergies  Allergen Reactions  . Penicillins Hives   Lab Results:  Results for orders placed or performed during the hospital encounter of 09/25/20 (from the past 48 hour(s))  Comprehensive metabolic panel     Status: Abnormal   Collection Time: 09/25/20  6:59 PM  Result Value Ref Range   Sodium 138 135 - 145 mmol/L   Potassium 3.2 (L) 3.5 - 5.1 mmol/L   Chloride 105 98 - 111 mmol/L   CO2 25 22 - 32 mmol/L   Glucose, Bld 116 (H) 70 - 99 mg/dL    Comment: Glucose reference range applies only to samples taken after fasting for at least 8 hours.   BUN 7 6 - 20 mg/dL   Creatinine, Ser 1.61 0.44 - 1.00 mg/dL   Calcium 9.1 8.9 - 09.6 mg/dL   Total Protein 8.0 6.5 - 8.1 g/dL   Albumin 4.3 3.5 - 5.0 g/dL   AST 18 15 - 41 U/L   ALT 13 0 - 44 U/L   Alkaline Phosphatase 62 38 - 126 U/L   Total Bilirubin  0.8 0.3 - 1.2 mg/dL   GFR, Estimated >04 >54 mL/min    Comment: (NOTE) Calculated using the CKD-EPI Creatinine Equation (2021)    Anion gap 8 5 - 15    Comment: Performed at [facility], [address] [place], [place] [address]  Ethanol     Status: None   Collection Time: 09/25/20  6:59 PM  Result Value Ref Range   Alcohol, Ethyl (B) <10 <10 mg/dL    Comment: (NOTE) Lowest detectable limit for serum alcohol is 10 mg/dL.  For medical purposes only. Performed at [facility], [address] [place], [place] [address]   Salicylate level     Status: Abnormal   Collection Time: 09/25/20  6:59 PM  Result Value Ref Range   Salicylate Lvl <7.0 (L) 7.0 - 30.0  mg/dL    Comment: Performed at [facility], [address] [place], [place] [address]  Acetaminophen level     Status: None   Collection Time: 09/25/20  6:59 PM  Result Value Ref Range   Acetaminophen (Tylenol), Serum 21 10 - 30 ug/mL    Comment: (NOTE) Therapeutic concentrations vary significantly. A range of 10-30 ug/mL  may be an effective concentration for many patients. However, some  are best treated at concentrations outside of this range. Acetaminophen concentrations >150 ug/mL at 4 hours after ingestion  and >50 ug/mL at 12 hours after ingestion are often associated with  toxic reactions.  Performed at [facility], [address] [place], [place] [address]   cbc     Status: Abnormal   Collection Time: 09/25/20  6:59 PM  Result Value Ref Range   WBC 4.1 4.0 - 10.5 K/uL   RBC 4.73 3.87 - 5.11 MIL/uL   Hemoglobin 12.0 12.0 - 15.0 g/dL   HCT 09.838.3 36 - 46 %   MCV 81.0 80.0 - 100.0 fL   MCH 25.4 (L) 26.0 - 34.0 pg   MCHC 31.3 30.0 - 36.0 g/dL   RDW 11.916.3 (H) 14.711.5 - 82.915.5 %   Platelets 234 150 - 400 K/uL   nRBC 0.0 0.0 - 0.2 %    Comment: Performed at [facility], [address] [place], [place] [address]  Sedimentation rate     Status: Abnormal   Collection  Time: 09/25/20  6:59 PM  Result Value Ref Range   Sed Rate 21 (H) 0 - 20 mm/hr    Comment: Performed at [facility], [address] [place], [place] [address]  CBC with Differential/Platelet     Status: Abnormal   Collection Time: 09/25/20  6:59 PM  Result Value Ref Range   WBC 4.2 4.0 - 10.5 K/uL   RBC 4.86 3.87 - 5.11 MIL/uL   Hemoglobin 12.2 12.0 - 15.0 g/dL   HCT 84.638.9 36 - 46 %   MCV 80.0 80.0 - 100.0 fL   MCH 25.1 (L) 26.0 - 34.0 pg   MCHC 31.4 30.0 - 36.0 g/dL   RDW 96.216.5 (H) 95.211.5 - 84.115.5 %   Platelets 243 150 - 400 K/uL   nRBC 0.0 0.0 - 0.2 %   Neutrophils Relative % 63 %   Neutro Abs 2.6 1.7 - 7.7 K/uL   Lymphocytes Relative 28 %   Lymphs Abs 1.2 0.7 - 4.0 K/uL   Monocytes Relative 9 %   Monocytes Absolute 0.4 0.1 - 1.0 K/uL   Eosinophils Relative 0 %   Eosinophils Absolute 0.0 0.0 - 0.5 K/uL   Basophils Relative 0 %   Basophils Absolute 0.0 0.0 - 0.1 K/uL   Immature Granulocytes 0 %   Abs Immature Granulocytes 0.01 0.00 - 0.07 K/uL    Comment: Performed at [facility], [address] [place], [place] [address]  Urine Drug Screen, Qualitative     Status: Abnormal   Collection Time: 09/25/20  9:48 PM  Result Value Ref Range   Tricyclic, Ur Screen NONE DETECTED NONE DETECTED   Amphetamines, Ur Screen NONE DETECTED NONE DETECTED   MDMA (Ecstasy)Ur Screen NONE DETECTED NONE DETECTED   Cocaine Metabolite,Ur [place] POSITIVE (A) NONE DETECTED   Opiate, Ur Screen NONE DETECTED NONE DETECTED   Phencyclidine (PCP) Ur S NONE DETECTED NONE DETECTED   Cannabinoid 50 Ng, Ur  POSITIVE (A) NONE DETECTED   Barbiturates, Ur Screen NONE  DETECTED NONE DETECTED   Benzodiazepine, Ur Scrn NONE DETECTED NONE DETECTED   Methadone Scn, Ur NONE DETECTED NONE DETECTED    Comment: (NOTE) Tricyclics + metabolites, urine    Cutoff 1000 ng/mL Amphetamines + metabolites, urine  Cutoff 1000 ng/mL MDMA (Ecstasy), urine              Cutoff 500 ng/mL Cocaine Metabolite, urine           Cutoff 300 ng/mL Opiate + metabolites, urine        Cutoff 300 ng/mL Phencyclidine (PCP), urine         Cutoff 25 ng/mL Cannabinoid, urine                 Cutoff 50 ng/mL Barbiturates + metabolites, urine  Cutoff 200 ng/mL Benzodiazepine, urine              Cutoff 200 ng/mL Methadone, urine                   Cutoff 300 ng/mL  The urine drug screen provides only a preliminary, unconfirmed analytical test result and should not be used for non-medical purposes. Clinical consideration and professional judgment should be applied to any positive drug screen result due to possible interfering substances. A more specific alternate chemical method must be used in order to obtain a confirmed analytical result. Gas chromatography / mass spectrometry (GC/MS) is the preferred confirm atory method. Performed at [facility], [address] [place], [place] [address]   Urinalysis, Complete w Microscopic Urine, Clean Catch     Status: Abnormal   Collection Time: 09/25/20  9:48 PM  Result Value Ref Range   Color, Urine YELLOW (A) YELLOW   APPearance HAZY (A) CLEAR   Specific Gravity, Urine >1.046 (H) 1.005 - 1.030   pH 6.0 5.0 - 8.0   Glucose, UA NEGATIVE NEGATIVE mg/dL   Hgb urine dipstick NEGATIVE NEGATIVE   Bilirubin Urine NEGATIVE NEGATIVE   Ketones, ur NEGATIVE NEGATIVE mg/dL   Protein, ur NEGATIVE NEGATIVE mg/dL   Nitrite NEGATIVE NEGATIVE   Leukocytes,Ua NEGATIVE NEGATIVE   RBC / HPF 0-5 0 - 5 RBC/hpf   WBC, UA 0-5 0 - 5 WBC/hpf   Bacteria, UA NONE SEEN NONE SEEN   Squamous Epithelial / LPF 0-5 0 - 5   Mucus PRESENT     Comment: Performed at [facility], [address], [place], [place] [address]  Pregnancy, urine     Status: None   Collection Time: 09/25/20  9:48 PM  Result Value Ref Range   Preg Test, Ur NEGATIVE NEGATIVE    Comment: Performed at [facility], [address] [place], [place] [address]  Respiratory Panel by RT PCR (Flu A&B,  Covid) - Nasopharyngeal Swab     Status: None   Collection Time: 09/25/20 11:03 PM   Specimen: Nasopharyngeal Swab  Result Value Ref Range   SARS Coronavirus 2 by RT PCR NEGATIVE NEGATIVE    Comment: (NOTE) SARS-CoV-2 target nucleic acids are NOT DETECTED.  The SARS-CoV-2 RNA is generally detectable in upper respiratoy specimens during the acute phase of infection. The lowest concentration of SARS-CoV-2 viral copies this assay can detect is 131 copies/mL. A negative result does not preclude SARS-Cov-2 infection and should not be used as the sole basis for treatment or other patient management decisions. A negative result may occur with  improper specimen collection/handling, submission of specimen other than nasopharyngeal swab, presence of viral mutation(s) within the areas  targeted by this assay, and inadequate number of viral copies (<131 copies/mL). A negative result must be combined with clinical observations, patient history, and epidemiological information. The expected result is Negative.  Fact Sheet for Patients:  https://www.moore.com/  Fact Sheet for Healthcare Providers:  https://www.young.biz/  This test is no t yet approved or cleared by the [place] FDA and  has been authorized for detection and/or diagnosis of SARS-CoV-2 by FDA under an Emergency Use Authorization (EUA). This EUA will remain  in effect (meaning this test can be used) for the duration of the COVID-19 declaration under Section 564(b)(1) of the Act, 21 U.S.C. section 360bbb-3(b)(1), unless the authorization is terminated or revoked sooner.     Influenza A by PCR NEGATIVE NEGATIVE   Influenza B by PCR NEGATIVE NEGATIVE    Comment: (NOTE) The Xpert Xpress SARS-CoV-2/FLU/RSV assay is intended as an aid in  the diagnosis of influenza from Nasopharyngeal swab specimens and  should not be used as a sole basis for treatment. Nasal washings and  aspirates are  unacceptable for Xpert Xpress SARS-CoV-2/FLU/RSV  testing.  Fact Sheet for Patients: https://www.moore.com/  Fact Sheet for Healthcare Providers: https://www.young.biz/  This test is not yet approved or cleared by the [place] FDA and  has been authorized for detection and/or diagnosis of SARS-CoV-2 by  FDA under an Emergency Use Authorization (EUA). This EUA will remain  in effect (meaning this test can be used) for the duration of the  Covid-19 declaration under Section 564(b)(1) of the Act, 21  U.S.C. section 360bbb-3(b)(1), unless the authorization is  terminated or revoked. Performed at [facility], [address] [place], [place] [address]   Lipid panel     Status: None   Collection Time: 09/26/20  3:59 PM  Result Value Ref Range   Cholesterol 152 0 - 200 mg/dL   Triglycerides 46 <782 mg/dL   HDL 50 >95 mg/dL   Total CHOL/HDL Ratio 3.0 RATIO   VLDL 9 0 - 40 mg/dL   LDL Cholesterol 93 0 - 99 mg/dL    Comment:        Total Cholesterol/HDL:CHD Risk Coronary Heart Disease Risk Table                     Men   Women  1/2 Average Risk   3.4   3.3  Average Risk       5.0   4.4  2 X Average Risk   9.6   7.1  3 X Average Risk  23.4   11.0        Use the calculated Patient Ratio above and the CHD Risk Table to determine the patient's CHD Risk.        ATP III CLASSIFICATION (LDL):  <100     mg/dL   Optimal  621-308  mg/dL   Near or Above                    Optimal  130-159  mg/dL   Borderline  657-846  mg/dL   High  >962     mg/dL   Very High Performed at [facility], [address] [place], [place] [address]     Blood Alcohol level:  Lab Results  Component Value Date   [facility] <10 09/25/2020   ETH <5 05/23/2015    Metabolic Disorder Labs:  No results found for: HGBA1C, MPG No results found for: PROLACTIN Lab Results  Component Value Date  CHOL 152 09/26/2020   TRIG 46 09/26/2020   HDL 50  09/26/2020   CHOLHDL 3.0 09/26/2020   VLDL 9 09/26/2020   LDLCALC 93 09/26/2020    Current Medications: Current Facility-Administered Medications  Medication Dose Route Frequency Provider Last Rate Last Admin  . acetaminophen (TYLENOL) tablet 650 mg  650 mg Oral Q6H PRN Clapacs, John T, MD      . alum & mag hydroxide-simeth (MAALOX/MYLANTA) 200-200-20 MG/5ML suspension 30 mL  30 mL Oral Q4H PRN Clapacs, John T, MD      . hydrOXYzine (ATARAX/VISTARIL) tablet 50 mg  50 mg Oral TID PRN Clapacs, Jackquline Denmark, MD      . LORazepam (ATIVAN) tablet 2 mg  2 mg Oral Q6H PRN Jesse Sans, MD   2 mg at 09/26/20 2146  . magnesium hydroxide (MILK OF MAGNESIA) suspension 30 mL  30 mL Oral Daily PRN Clapacs, John T, MD      . NIFEdipine (PROCARDIA-XL/NIFEDICAL-XL) 24 hr tablet 30 mg  30 mg Oral Daily Clapacs, John T, MD      . risperiDONE (RISPERDAL) tablet 1 mg  1 mg Oral QHS Jesse Sans, MD      . sertraline (ZOLOFT) tablet 50 mg  50 mg Oral Daily Les Pou M, MD      . traZODone (DESYREL) tablet 100 mg  100 mg Oral QHS PRN Clapacs, Jackquline Denmark, MD   100 mg at 09/26/20 2147   PTA Medications: Medications Prior to Admission  Medication Sig Dispense Refill Last Dose  . ferrous sulfate 325 (65 FE) MG tablet Take 1 tablet (325 mg total) by mouth 2 (two) times daily with a meal. 60 tablet 11   . ibuprofen (ADVIL,MOTRIN) 600 MG tablet Take 1 tablet (600 mg total) by mouth every 6 (six) hours. 60 tablet 3   . medroxyPROGESTERone (DEPO-PROVERA) 150 MG/ML injection Inject 1 mL (150 mg total) into the muscle every 3 (three) months. 1 mL 3   . NIFEdipine (PROCARDIA-XL/ADALAT CC) 30 MG 24 hr tablet Take 1 tablet (30 mg total) by mouth daily. 30 tablet 1   . oxyCODONE-acetaminophen (PERCOCET/ROXICET) 5-325 MG tablet Take 1 tablet by mouth every 4 (four) hours as needed for moderate pain (pain score 4-7/10). 20 tablet 0   . Prenatal Vit-Fe Fumarate-FA (PRENATAL MULTIVITAMIN) TABS tablet Take 1 tablet by mouth  daily at 12 noon.       Musculoskeletal: Strength & Muscle Tone: within normal limits Gait & Station: normal Patient leans: N/A  Psychiatric Specialty Exam: Physical Exam Vitals and nursing note reviewed.  Constitutional:      General: She is in acute distress.  HENT:     Head: Normocephalic and atraumatic.     Right Ear: External ear normal.     Left Ear: External ear normal.     Nose: Nose normal.     Mouth/Throat:     Mouth: Mucous membranes are moist.     Pharynx: Oropharynx is clear.  Eyes:     Extraocular Movements: Extraocular movements intact.     Conjunctiva/sclera: Conjunctivae normal.     Pupils: Pupils are equal, round, and reactive to light.  Cardiovascular:     Rate and Rhythm: Normal rate.     Pulses: Normal pulses.  Pulmonary:     Effort: Pulmonary effort is normal.     Breath sounds: Normal breath sounds.  Abdominal:     General: Abdomen is flat.     Palpations: Abdomen is soft.  Musculoskeletal:  General: No swelling. Normal range of motion.     Cervical back: Normal range of motion and neck supple.  Skin:    General: Skin is warm and dry.  Neurological:     General: No focal deficit present.     Mental Status: She is alert and oriented to person, place, and time.  Psychiatric:        Attention and Perception: She is inattentive.        Mood and Affect: Mood is anxious. Affect is labile.        Speech: Speech is delayed.        Behavior: Behavior is agitated.        Thought Content: Thought content is paranoid.        Cognition and Memory: Cognition is impaired. Memory is impaired.        Judgment: Judgment is inappropriate.     Review of Systems  Constitutional: Negative for appetite change and fatigue.  HENT: Negative for rhinorrhea and sore throat.   Eyes: Negative for photophobia and visual disturbance.  Respiratory: Negative for cough and shortness of breath.   Cardiovascular: Negative for chest pain and palpitations.   Gastrointestinal: Negative for constipation, diarrhea, nausea and vomiting.  Endocrine: Positive for cold intolerance. Negative for polyuria.  Genitourinary: Negative for difficulty urinating and dysuria.  Musculoskeletal: Negative for arthralgias and back pain.  Skin: Negative for rash and wound.  Allergic/Immunologic: Negative for food allergies and immunocompromised state.  Neurological: Negative for dizziness and numbness.  Hematological: Negative for adenopathy. Does not bruise/bleed easily.  Psychiatric/Behavioral: Positive for agitation, behavioral problems, confusion, dysphoric mood and sleep disturbance. The patient is nervous/anxious.     Blood pressure 103/73, pulse 68, temperature 97.9 F (36.6 C), temperature source Oral, resp. rate 18, height 5\' 2"  (1.575 m), weight 38.6 kg, SpO2 100 %, unknown if currently breastfeeding.Body mass index is 15.55 kg/m.  General Appearance: Disheveled and Guarded  Eye Contact:  Poor  Speech:  Blocked and Slow  Volume:  Decreased  Mood:  Anxious and Irritable  Affect:  Congruent and Tearful  Thought Process:  Disorganized  Orientation:  Full (Time, Place, and Person)  Thought Content:  Paranoid Ideation and Rumination  Suicidal Thoughts:  No  Homicidal Thoughts:  No  Memory:  Immediate;   Poor Recent;   Poor Remote;   Poor  Judgement:  Impaired  Insight:  Lacking  Psychomotor Activity:  Restlessness  Concentration:  Concentration: Fair and Attention Span: Fair  Recall:  Poor  Fund of Knowledge:  Poor  Language:  Fair  Akathisia:  Negative  Handed:  Right  AIMS (if indicated):     Assets:  Desire for Improvement Financial Resources/Insurance Housing Intimacy Physical Health Resilience Social Support  ADL's:  Impaired  Cognition:  Impaired,  Moderate  Sleep:  Number of Hours: 6.75       Treatment Plan Summary: Daily contact with patient to assess and evaluate symptoms and progress in treatment and Medication management  Plan: Continue inpatient admission. Restart Zoloft 50 mg daily. Increase Risperdal 1 mg QHS for paranoia and psychosis. Ativan PRN for anxiety.   Observation Level/Precautions:  15 minute checks  Laboratory:  Completed in ED  Psychotherapy:    Medications:    Consultations:    Discharge Concerns:    Estimated LOS:  Other:     Physician Treatment Plan for Primary Diagnosis: Psychosis (HCC) Long Term Goal(s): Improvement in symptoms so as ready for discharge  Short Term Goals: Ability to  identify changes in lifestyle to reduce recurrence of condition will improve, Ability to verbalize feelings will improve, Ability to demonstrate self-control will improve, Ability to identify and develop effective coping behaviors will improve, Compliance with prescribed medications will improve and Ability to identify triggers associated with substance abuse/mental health issues will improve  Physician Treatment Plan for Secondary Diagnosis: Principal Problem:   Psychosis (HCC) Active Problems:   Cocaine abuse (HCC)  Long Term Goal(s): Improvement in symptoms so as ready for discharge  Short Term Goals: Ability to identify changes in lifestyle to reduce recurrence of condition will improve, Ability to verbalize feelings will improve, Ability to demonstrate self-control will improve, Ability to identify and develop effective coping behaviors will improve, Compliance with prescribed medications will improve and Ability to identify triggers associated with substance abuse/mental health issues will improve  I certify that inpatient services furnished can reasonably be expected to improve the patient's condition.    Jesse Sans, MD 11/2/20212:10 PM

## 2020-09-27 NOTE — Progress Notes (Signed)
Pt is alert and oriented to person, place, time and situation. Pt has been visible in the dayroom but secluded to self, does not interact with peers or staff. Pt makes odd sounds like grunts at times, and bizarre posturing, stares inappropriately. Pt is tearful at times, when staff inquire regarding why she is crying, pt reports it is because no one had called for her today, including her fiance, her daughter or mother. Pt was reminded and encouraged to call them if she wanted to talk to them. Pt spoke with her mother today on the phone. Also emotional support was provided. Pt's appetite has been poor, eating small amounts from each meal. At times thought blocking is noted. Pt denies suicidal and homicidal ideation, denies hallucinations, denies depression and anxiety. Affect is blunted. No acute distress noted, none reported. Pt has poor insight regarding admission triggers reports that she does not know why she is here and is focused on going home. Will continue to monitor pt per Q15 minute face checks and monitor for safety and progress.

## 2020-09-27 NOTE — Progress Notes (Signed)
Patient calm and pleasant during assessment denying SI/HI/AVH. Patient presents as presenting responding to internal stimuli. Patient observed interacting appropriately with staff and peers on the unit. Patient compliant with medication administration per MD orders. Patient given education, support, and encouragement to be active in her treatment plan. Patient being monitored Q 15 minutes for safety per unit protocol. Patient remains safe on the unit.  

## 2020-09-27 NOTE — Plan of Care (Signed)
  Problem: Activity: Goal: Will verbalize the importance of balancing activity with adequate rest periods Outcome: Progressing   Problem: Education: Goal: Will be free of psychotic symptoms Outcome: Progressing Goal: Knowledge of the prescribed therapeutic regimen will improve Outcome: Progressing   Problem: Coping: Goal: Coping ability will improve Outcome: Progressing Goal: Will verbalize feelings Outcome: Progressing   Problem: Health Behavior/Discharge Planning: Goal: Compliance with prescribed medication regimen will improve Outcome: Progressing   Problem: Nutritional: Goal: Ability to achieve adequate nutritional intake will improve Outcome: Progressing   Problem: Role Relationship: Goal: Ability to communicate needs accurately will improve Outcome: Progressing Goal: Ability to interact with others will improve Outcome: Progressing   Problem: Safety: Goal: Ability to redirect hostility and anger into socially appropriate behaviors will improve Outcome: Progressing Goal: Ability to remain free from injury will improve Outcome: Progressing   Problem: Self-Care: Goal: Ability to participate in self-care as condition permits will improve Outcome: Progressing   Problem: Self-Concept: Goal: Will verbalize positive feelings about self Outcome: Progressing   Problem: Education: Goal: Knowledge of [facility] Education information/materials will improve Outcome: Progressing Goal: Emotional status will improve Outcome: Progressing Goal: Mental status will improve Outcome: Progressing Goal: Verbalization of understanding the information provided will improve Outcome: Progressing   Problem: Activity: Goal: Interest or engagement in activities will improve Outcome: Progressing Goal: Sleeping patterns will improve Outcome: Progressing   Problem: Coping: Goal: Ability to verbalize frustrations and anger appropriately will improve Outcome:  Progressing Goal: Ability to demonstrate self-control will improve Outcome: Progressing   Problem: Health Behavior/Discharge Planning: Goal: Identification of resources available to assist in meeting health care needs will improve Outcome: Progressing Goal: Compliance with treatment plan for underlying cause of condition will improve Outcome: Progressing   Problem: Physical Regulation: Goal: Ability to maintain clinical measurements within normal limits will improve Outcome: Progressing   Problem: Safety: Goal: Periods of time without injury will increase Outcome: Progressing   

## 2020-09-27 NOTE — Plan of Care (Signed)
Patient disorganized, unsure why she is still here. Patient given education.   Problem: Education: Goal: Will be free of psychotic symptoms Outcome: Not Progressing

## 2020-09-27 NOTE — [facility] Suicide Risk Assessment (Signed)
[facility] INPATIENT:  Family/Significant Other Suicide Prevention Education  Suicide Prevention Education:  Contact Attempts: [name], mother, [phone] has been identified by the patient as the family member/significant other with whom the patient will be residing, and identified as the person(s) who will aid the patient in the event of a mental health crisis.  With written consent from the patient, two attempts were made to provide suicide prevention education, prior to and/or following the patient's discharge.  We were unsuccessful in providing suicide prevention education.  A suicide education pamphlet was given to the patient to share with family/significant other.  Date and time of first attempt: 09/27/2020 at 1:13PM Date and time of second attempt: Second attempt is needed.   CSW called the number provided and the voicemail came on, however, the voicemail was in [place] and CSW did not understand.  CSW did not hear the beep for voicemail and the message continued to repeat itself.   Harden Mo 09/27/2020, 1:12 PM

## 2020-09-27 NOTE — [facility] Suicide Risk Assessment (Signed)
[facility] Admission Suicide Risk Assessment   Nursing information obtained from:  Patient Demographic factors:  Unemployed, Adolescent or young adult Current Mental Status:  NA Loss Factors:  NA Historical Factors:  NA Risk Reduction Factors:  Positive social support, Living with another person, especially a relative  Total Time spent with patient: 45 minutes Principal Problem: Psychosis (HCC) Diagnosis:  Principal Problem:   Psychosis (HCC) Active Problems:   Cocaine abuse (HCC)  Subjective Data: Patient seen one-on-one today. She is shivering on exam stating she is very cold. She exhibits thought blocking, delayed speech, paranoia, and extreme anxiety. She is very tearful on exam. She states that she does not understand why she is here. She states she was simply annoyed with her fiance and stopped talking for 2 days. She notes that after leaving the hospital she stopped taking her medications. She feels she can control her own emotions simply by completing her tasks and speaking to her mother. She is very distressed that she is in the hospital away from her family, and requests that I check on her daughter. Attempted to call mother, Zella Ball, x3 but no answer and voicemail in [place]. Per chart review, ED team able to speak with mother who reports that the patient is having a nervous breakdown. She had not been communicating or caring for herself for two days, and not answering her family. Mother believes her daughter is diagnosed with schizophrenia, but is uncertain. Previously taking Zoloft and Risperdal without side effects.   Continued Clinical Symptoms:  Alcohol Use Disorder Identification Test Final Score (AUDIT): 0 The "Alcohol Use Disorders Identification Test", Guidelines for Use in Primary Care, Second Edition.  World Science writer [facility]). Score between 0-7:  no or low risk or alcohol related problems. Score between 8-15:  moderate risk of alcohol related problems. Score between  16-19:  high risk of alcohol related problems. Score 20 or above:  warrants further diagnostic evaluation for alcohol dependence and treatment.   CLINICAL FACTORS:   Severe Anxiety and/or Agitation Alcohol/Substance Abuse/Dependencies Currently Psychotic Unstable or Poor Therapeutic Relationship Previous Psychiatric Diagnoses and Treatments   Musculoskeletal: Strength & Muscle Tone: within normal limits Gait & Station: normal Patient leans: N/A  Psychiatric Specialty Exam: Physical Exam Vitals and nursing note reviewed.  Constitutional:      General: She is in acute distress.  HENT:     Head: Normocephalic and atraumatic.     Right Ear: External ear normal.     Left Ear: External ear normal.     Nose: Nose normal.     Mouth/Throat:     Mouth: Mucous membranes are moist.     Pharynx: Oropharynx is clear.  Eyes:     Extraocular Movements: Extraocular movements intact.     Conjunctiva/sclera: Conjunctivae normal.     Pupils: Pupils are equal, round, and reactive to light.  Cardiovascular:     Rate and Rhythm: Normal rate.     Pulses: Normal pulses.  Pulmonary:     Effort: Pulmonary effort is normal.     Breath sounds: Normal breath sounds.  Abdominal:     General: Abdomen is flat.     Palpations: Abdomen is soft.  Musculoskeletal:        General: No swelling. Normal range of motion.     Cervical back: Normal range of motion and neck supple.  Skin:    General: Skin is warm and dry.  Neurological:     General: No focal deficit present.  Mental Status: She is alert and oriented to person, place, and time.  Psychiatric:        Attention and Perception: She is inattentive.        Mood and Affect: Mood is anxious. Affect is labile.        Speech: Speech is delayed.        Behavior: Behavior is agitated.        Thought Content: Thought content is paranoid.        Cognition and Memory: Cognition is impaired. Memory is impaired.        Judgment: Judgment is  inappropriate.     Review of Systems  Constitutional: Negative for appetite change and fatigue.  HENT: Negative for rhinorrhea and sore throat.   Eyes: Negative for photophobia and visual disturbance.  Respiratory: Negative for cough and shortness of breath.   Cardiovascular: Negative for chest pain and palpitations.  Gastrointestinal: Negative for constipation, diarrhea, nausea and vomiting.  Endocrine: Positive for cold intolerance. Negative for polyuria.  Genitourinary: Negative for difficulty urinating and dysuria.  Musculoskeletal: Negative for arthralgias and back pain.  Skin: Negative for rash and wound.  Allergic/Immunologic: Negative for food allergies and immunocompromised state.  Neurological: Negative for dizziness and numbness.  Hematological: Negative for adenopathy. Does not bruise/bleed easily.  Psychiatric/Behavioral: Positive for agitation, behavioral problems, confusion, dysphoric mood and sleep disturbance. The patient is nervous/anxious.     Blood pressure 103/73, pulse 68, temperature 97.9 F (36.6 C), temperature source Oral, resp. rate 18, height 5\' 2"  (1.575 m), weight 38.6 kg, SpO2 100 %, unknown if currently breastfeeding.Body mass index is 15.55 kg/m.  General Appearance: Disheveled and Guarded  Eye Contact:  Poor  Speech:  Blocked and Slow  Volume:  Decreased  Mood:  Anxious and Irritable  Affect:  Congruent and Tearful  Thought Process:  Disorganized  Orientation:  Full (Time, Place, and Person)  Thought Content:  Paranoid Ideation and Rumination  Suicidal Thoughts:  No  Homicidal Thoughts:  No  Memory:  Immediate;   Poor Recent;   Poor Remote;   Poor  Judgement:  Impaired  Insight:  Lacking  Psychomotor Activity:  Restlessness  Concentration:  Concentration: Fair and Attention Span: Fair  Recall:  Poor  Fund of Knowledge:  Poor  Language:  Fair  Akathisia:  Negative  Handed:  Right  AIMS (if indicated):     Assets:  Desire for  Improvement Financial Resources/Insurance Housing Intimacy Physical Health Resilience Social Support  ADL's:  Impaired  Cognition:  Impaired,  Moderate  Sleep:  Number of Hours: 6.75      COGNITIVE FEATURES THAT CONTRIBUTE TO RISK:  Closed-mindedness and Loss of executive function    SUICIDE RISK:   Moderate:  Frequent suicidal ideation with limited intensity, and duration, some specificity in terms of plans, no associated intent, good self-control, limited dysphoria/symptomatology, some risk factors present, and identifiable protective factors, including available and accessible social support.  PLAN OF CARE: Continue inpatient admission. Restart Zoloft 50 mg daily. Increase Risperdal 1 mg QHS for paranoia and psychosis. Ativan PRN for anxiety.   I certify that inpatient services furnished can reasonably be expected to improve the patient's condition.   , MD 09/27/2020, 2:03 PM

## 2020-09-27 NOTE — [facility] Counselor (Signed)
Patient declining aftercare referrals at this time.   Penni Homans, MSW, LCSW 09/27/2020 1:10 PM

## 2020-09-28 MED ORDER — RISPERIDONE 1 MG PO TABS
2.0000 mg | ORAL_TABLET | Freq: Every day | ORAL | Status: DC
  Administered 2020-09-28 – 2020-09-29 (×2): 2 mg via ORAL
  Filled 2020-09-28: qty 2

## 2020-09-28 NOTE — Progress Notes (Signed)
[facility]  09/28/2020 1:42 PM Robin Wood  MRN:  [id number]   Subjective:  Patient seen during treatment team and again one-on-one. She continues to deny suicidal ideations and homicidal ideations, visual hallucinations and auditory hallucinations.  However, she continues to appear anxious and tremulous on exam. Her speech is somewhat less delayed, but she continues to appear to be internally preoccupied. She states she wants to go home to her family, and feels like people are holding her in the hospital for no reason. She states she simply got anxious and did not feel like speaking to anyone before admission. She continues to have poor insight into her illness, although she has been medication compliant in the hospital. Appetite remains very poor. Will increase Risperdal again tonight.   Principal Problem: Psychosis (HCC) Diagnosis: Principal Problem:   Psychosis (HCC) Active Problems:   Cocaine abuse (HCC)  Total Time spent with patient: 30 minutes  Past Psychiatric History: According to the mother the patient has [facility] and at another hospital. She suspects the patient may have schizophrenia but does not know for certain. No known history of suicide attempts or violence  Past Medical History:  Past Medical History:  Diagnosis Date  . Anxiety   . Insomnia     Past Surgical History:  Procedure Laterality Date  . CESAREAN SECTION N/A 07/05/2018   Procedure: CESAREAN SECTION;  Surgeon: Conard NovakJackson, Stephen D, MD;  Location: [facility] ORS;  Service: Obstetrics;  Laterality: N/A;  . NO PAST SURGERIES     Family History: History reviewed. No pertinent family history. Family Psychiatric  History:  Mother reports that both sides of the family have had people with mental health problems. Patient's father reportedly has schizophrenia Social History:  Social History   Substance and Sexual Activity  Alcohol Use Yes   Comment: "not frequently"      Social History   Substance and Sexual Activity  Drug Use No    Social History   Socioeconomic History  . Marital status: Single    Spouse name: Not on file  . Number of children: 0  . Years of education: Not on file  . Highest education level: 11th grade  Occupational History  . Not on file  Tobacco Use  . Smoking status: Current Some Day Smoker    Types: Cigarettes    Last attempt to quit: 12/17/2017    Years since quitting: 2.7  . Smokeless tobacco: Never Used  Vaping Use  . Vaping Use: Every day  Substance and Sexual Activity  . Alcohol use: Yes    Comment: "not frequently"  . Drug use: No  . Sexual activity: Yes    Birth control/protection: Injection    Comment: Depo  Other Topics Concern  . Not on file  Social History Narrative  . Not on file   Social Determinants of Health   Financial Resource Strain:   . Difficulty of Paying Living Expenses: Not on file  Food Insecurity:   . Worried About Programme researcher, broadcasting/film/videounning Out of Food in the Last Year: Not on file  . Ran Out of Food in the Last Year: Not on file  Transportation Needs:   . Lack of Transportation (Medical): Not on file  . Lack of Transportation (Non-Medical): Not on file  Physical Activity:   . Days of Exercise per Week: Not on file  . Minutes of Exercise per Session: Not on file  Stress:   . Feeling of Stress : Not on  file  Social Connections:   . Frequency of Communication with Friends and Family: Not on file  . Frequency of Social Gatherings with Friends and Family: Not on file  . Attends Religious Services: Not on file  . Active Member of Clubs or Organizations: Not on file  . Attends Banker Meetings: Not on file  . Marital Status: Not on file   Additional Social History:       Sleep: Fair  Appetite:  Poor  Current Medications: Current Facility-Administered Medications  Medication Dose Route Frequency Provider Last Rate Last Admin  . acetaminophen (TYLENOL) tablet 650 mg  650 mg Oral  Q6H PRN Clapacs, John T, MD      . alum & mag hydroxide-simeth (MAALOX/MYLANTA) 200-200-20 MG/5ML suspension 30 mL  30 mL Oral Q4H PRN Clapacs, John T, MD      . hydrOXYzine (ATARAX/VISTARIL) tablet 50 mg  50 mg Oral TID PRN Clapacs, Jackquline Denmark, MD      . LORazepam (ATIVAN) tablet 2 mg  2 mg Oral Q6H PRN Jesse Sans, MD   2 mg at 09/27/20 2116  . magnesium hydroxide (MILK OF MAGNESIA) suspension 30 mL  30 mL Oral Daily PRN Clapacs, John T, MD      . NIFEdipine (PROCARDIA-XL/NIFEDICAL-XL) 24 hr tablet 30 mg  30 mg Oral Daily Clapacs, John T, MD      . risperiDONE (RISPERDAL) tablet 2 mg  2 mg Oral QHS Jesse Sans, MD      . sertraline (ZOLOFT) tablet 50 mg  50 mg Oral Daily Jesse Sans, MD   50 mg at 09/28/20 0827  . traZODone (DESYREL) tablet 100 mg  100 mg Oral QHS PRN Clapacs, Jackquline Denmark, MD   100 mg at 09/27/20 2116    Lab Results:  Results for orders placed or performed during the hospital encounter of 09/25/20 (from the past 48 hour(s))  Lipid panel     Status: None   Collection Time: 09/26/20  3:59 PM  Result Value Ref Range   Cholesterol 152 0 - 200 mg/dL   Triglycerides 46 <423 mg/dL   HDL 50 >53 mg/dL   Total CHOL/HDL Ratio 3.0 RATIO   VLDL 9 0 - 40 mg/dL   LDL Cholesterol 93 0 - 99 mg/dL    Comment:        Total Cholesterol/HDL:CHD Risk Coronary Heart Disease Risk Table                     Men   Women  1/2 Average Risk   3.4   3.3  Average Risk       5.0   4.4  2 X Average Risk   9.6   7.1  3 X Average Risk  23.4   11.0        Use the calculated Patient Ratio above and the CHD Risk Table to determine the patient's CHD Risk.        ATP III CLASSIFICATION (LDL):  <100     mg/dL   Optimal  614-431  mg/dL   Near or Above                    Optimal  130-159  mg/dL   Borderline  540-086  mg/dL   High  >761     mg/dL   Very High Performed at [facility] [place], [address] [place], [place] [address]     Blood Alcohol level:  Lab Results  Component  Value Date   ETH <10 09/25/2020   ETH <5 05/23/2015    Metabolic Disorder Labs: No results found for: HGBA1C, MPG No results found for: PROLACTIN Lab Results  Component Value Date   CHOL 152 09/26/2020   TRIG 46 09/26/2020   HDL 50 09/26/2020   CHOLHDL 3.0 09/26/2020   VLDL 9 09/26/2020   LDLCALC 93 09/26/2020    Physical Findings: AIMS:  , ,  ,  ,    CIWA:    COWS:     Musculoskeletal: Strength & Muscle Tone: within normal limits Gait & Station: normal Patient leans: N/A  Psychiatric Specialty Exam: Physical Exam Vitals and nursing note reviewed.  Constitutional:      General: She is in acute distress.  HENT:     Head: Normocephalic and atraumatic.     Right Ear: External ear normal.     Left Ear: External ear normal.     Nose: Nose normal.     Mouth/Throat:     Mouth: Mucous membranes are moist.     Pharynx: Oropharynx is clear.  Eyes:     Extraocular Movements: Extraocular movements intact.     Conjunctiva/sclera: Conjunctivae normal.     Pupils: Pupils are equal, round, and reactive to light.  Cardiovascular:     Rate and Rhythm: Normal rate.     Pulses: Normal pulses.  Pulmonary:     Effort: Pulmonary effort is normal.     Breath sounds: Normal breath sounds.  Abdominal:     General: Abdomen is flat.     Palpations: Abdomen is soft.  Musculoskeletal:        General: No swelling. Normal range of motion.     Cervical back: Normal range of motion and neck supple.  Skin:    General: Skin is warm and dry.  Neurological:     General: No focal deficit present.     Mental Status: She is alert and oriented to person, place, and time.  Psychiatric:        Attention and Perception: She is inattentive.        Mood and Affect: Mood is anxious. Affect is tearful.        Speech: Speech is delayed.        Behavior: Behavior is agitated.        Thought Content: Thought content is paranoid.        Cognition and Memory: Cognition is impaired. Memory is impaired.         Judgment: Judgment is impulsive.     Review of Systems  Constitutional: Positive for appetite change. Negative for fatigue.  HENT: Negative for rhinorrhea and sore throat.   Eyes: Negative for photophobia and visual disturbance.  Respiratory: Negative for cough and shortness of breath.   Cardiovascular: Negative for chest pain and palpitations.  Gastrointestinal: Negative for constipation, diarrhea, nausea and vomiting.  Endocrine: Positive for cold intolerance. Negative for heat intolerance.  Genitourinary: Negative for difficulty urinating and dysuria.  Musculoskeletal: Negative for arthralgias and back pain.  Skin: Negative for rash and wound.  Allergic/Immunologic: Negative for food allergies and immunocompromised state.  Neurological: Negative for dizziness and headaches.  Hematological: Negative for adenopathy. Does not bruise/bleed easily.  Psychiatric/Behavioral: Positive for agitation, confusion, dysphoric mood and sleep disturbance. Negative for suicidal ideas. The patient is nervous/anxious.     Blood pressure 110/90, pulse 92, temperature 98.4 F (36.9 C), temperature source Oral, resp. rate 18, height 5\' 2"  (1.575  m), weight 38.6 kg, SpO2 100 %, unknown if currently breastfeeding.Body mass index is 15.55 kg/m.  General Appearance: Disheveled and Guarded  Eye Contact:  Poor  Speech:  Garbled  Volume:  Decreased  Mood:  Anxious and Irritable  Affect:  Blunt  Thought Process:  Disorganized  Orientation:  Full (Time, Place, and Person)  Thought Content:  Illogical, Paranoid Ideation and Rumination  Suicidal Thoughts:  No  Homicidal Thoughts:  No  Memory:  Immediate;   Poor Recent;   Poor Remote;   Poor  Judgement:  Impaired  Insight:  Lacking  Psychomotor Activity:  Decreased  Concentration:  Concentration: Poor and Attention Span: Poor  Recall:  Poor  Fund of Knowledge:  Poor  Language:  Fair  Akathisia:  Negative  Handed:  Right  AIMS (if indicated):      Assets:  Desire for Improvement Financial Resources/Insurance Housing Intimacy Resilience Social Support  ADL's:  Impaired  Cognition:  Impaired,  Mild  Sleep:  Number of Hours: 8.15     Treatment Plan Summary: Daily contact with patient to assess and evaluate symptoms and progress in treatment and Medication management. Increase Risperdal to 2 mg QHS for continued psychosis. Continue Sertraline 50 mg daily for depression and anxiety. Ativan 2 mg Q6hr PRN anxiety.   Jesse Sans, MD 09/28/2020, 1:42 PM

## 2020-09-28 NOTE — Progress Notes (Signed)
Recreation Therapy Notes   Date: 09/28/2020  Time: 9:30 am   Location: Craft room  Behavioral response: Appropriate  Intervention Topic: Problem Solving    Discussion/Intervention:  Group content on today was focused on problem solving. The group described what problem solving is. Patients expressed how problems affect them and how they deal with problems. Individuals identified healthy ways to deal with problems. Patients explained what normally happens to them when they do not deal with problems. The group expressed reoccurring problems for them. The group participated in the intervention "Put the story together" with their peers and worked together to put a story that was broken up in the correct order.  Clinical Observations/Feedback: Patient came to group and explained that she calls someone and talks about things when she has a problem. Individual was social with peers and staff while participating in the intervention.  Dontrey Snellgrove LRT/CTRS         Keshaun Dubey 09/28/2020 2:35 PM

## 2020-09-28 NOTE — Plan of Care (Signed)
Patient still presents responding to internal stimuli. Patient is redirectable and cooperative   Problem: Education: Goal: Will be free of psychotic symptoms Outcome: Not Progressing

## 2020-09-28 NOTE — Progress Notes (Signed)
Pt is alert and oriented to person, but not to time, place, or situation. Pt is calm, cooperative, thought blocking, blunted affect, wanders slowly in the hallways, tearful at times, spends time talking to her significant other on the phone, otherwise spends time resting quietly in her room. Pt has poor insight, unable to report a reason for admission. Will continue to monitor pt per Q15 minute face checks and monitor for safety and progress.

## 2020-09-28 NOTE — Progress Notes (Signed)
Patient calm and pleasant during assessment denying SI/HI/AVH. Patient presents as presenting responding to internal stimuli. Patient observed interacting appropriately with staff and peers on the unit. Patient compliant with medication administration per MD orders. Patient given education, support, and encouragement to be active in her treatment plan. Patient being monitored Q 15 minutes for safety per unit protocol. Patient remains safe on the unit.

## 2020-09-28 NOTE — [facility] Suicide Risk Assessment (Signed)
[facility] INPATIENT:  Family/Significant Other Suicide Prevention Education  Suicide Prevention Education:  Contact Attempts: [name], mother, [phone] has been identified by the patient as the family member/significant other with whom the patient will be residing, and identified as the person(s) who will aid the patient in the event of a mental health crisis.  With written consent from the patient, two attempts were made to provide suicide prevention education, prior to and/or following the patient's discharge.  We were unsuccessful in providing suicide prevention education.  A suicide education pamphlet was given to the patient to share with family/significant other.  Date and time of first attempt:09/27/2020 at 1:13PM Date and time of second attempt: 09/28/2020 at 10:02AM  CSW called the number provided again and the same situation occurred.  Voicemail came on and voiecmail was in [place].  CSW did not hear the beep for voicemail and the message continued to repeat itself.    Harden Mo 09/28/2020, 10:01 AM

## 2020-09-28 NOTE — Tx Team (Addendum)
Interdisciplinary Treatment and Diagnostic Plan Update  09/28/2020 Time of Session: 9:00AM Robin Wood MRN: [id number]  Principal Diagnosis: Psychosis [facility])  Secondary Diagnoses: Principal Problem:   Psychosis (HCC) Active Problems:   Cocaine abuse (HCC)   Current Medications:  Current Facility-Administered Medications  Medication Dose Route Frequency Provider Last Rate Last Admin  . acetaminophen (TYLENOL) tablet 650 mg  650 mg Oral Q6H PRN Clapacs, John T, MD      . alum & mag hydroxide-simeth (MAALOX/MYLANTA) 200-200-20 MG/5ML suspension 30 mL  30 mL Oral Q4H PRN Clapacs, John T, MD      . hydrOXYzine (ATARAX/VISTARIL) tablet 50 mg  50 mg Oral TID PRN Clapacs, Jackquline Denmark, MD      . LORazepam (ATIVAN) tablet 2 mg  2 mg Oral Q6H PRN Jesse Sans, MD   2 mg at 09/27/20 2116  . magnesium hydroxide (MILK OF MAGNESIA) suspension 30 mL  30 mL Oral Daily PRN Clapacs, John T, MD      . NIFEdipine (PROCARDIA-XL/NIFEDICAL-XL) 24 hr tablet 30 mg  30 mg Oral Daily Clapacs, John T, MD      . risperiDONE (RISPERDAL) tablet 1 mg  1 mg Oral QHS Jesse Sans, MD   1 mg at 09/27/20 2116  . sertraline (ZOLOFT) tablet 50 mg  50 mg Oral Daily Jesse Sans, MD   50 mg at 09/28/20 0827  . traZODone (DESYREL) tablet 100 mg  100 mg Oral QHS PRN Clapacs, Jackquline Denmark, MD   100 mg at 09/27/20 2116   PTA Medications: Medications Prior to Admission  Medication Sig Dispense Refill Last Dose  . ferrous sulfate 325 (65 FE) MG tablet Take 1 tablet (325 mg total) by mouth 2 (two) times daily with a meal. 60 tablet 11   . ibuprofen (ADVIL,MOTRIN) 600 MG tablet Take 1 tablet (600 mg total) by mouth every 6 (six) hours. 60 tablet 3   . medroxyPROGESTERone (DEPO-PROVERA) 150 MG/ML injection Inject 1 mL (150 mg total) into the muscle every 3 (three) months. 1 mL 3   . NIFEdipine (PROCARDIA-XL/ADALAT CC) 30 MG 24 hr tablet Take 1 tablet (30 mg total) by mouth daily. 30 tablet 1   . oxyCODONE-acetaminophen  (PERCOCET/ROXICET) 5-325 MG tablet Take 1 tablet by mouth every 4 (four) hours as needed for moderate pain (pain score 4-7/10). 20 tablet 0   . Prenatal Vit-Fe Fumarate-FA (PRENATAL MULTIVITAMIN) TABS tablet Take 1 tablet by mouth daily at 12 noon.       Patient Stressors:    Patient Strengths:    Treatment Modalities: Medication Management, Group therapy, Case management,  1 to 1 session with clinician, Psychoeducation, Recreational therapy.   Physician Treatment Plan for Primary Diagnosis: Psychosis (HCC) Long Term Goal(s): Improvement in symptoms so as ready for discharge Improvement in symptoms so as ready for discharge   Short Term Goals: Ability to identify changes in lifestyle to reduce recurrence of condition will improve Ability to verbalize feelings will improve Ability to demonstrate self-control will improve Ability to identify and develop effective coping behaviors will improve Compliance with prescribed medications will improve Ability to identify triggers associated with substance abuse/mental health issues will improve Ability to identify changes in lifestyle to reduce recurrence of condition will improve Ability to verbalize feelings will improve Ability to demonstrate self-control will improve Ability to identify and develop effective coping behaviors will improve Compliance with prescribed medications will improve Ability to identify triggers associated with substance abuse/mental health issues will improve  Medication Management:  Evaluate patient's response, side effects, and tolerance of medication regimen.  Therapeutic Interventions: 1 to 1 sessions, Unit Group sessions and Medication administration.  Evaluation of Outcomes: Not Progressing  Physician Treatment Plan for Secondary Diagnosis: Principal Problem:   Psychosis (HCC) Active Problems:   Cocaine abuse (HCC)  Long Term Goal(s): Improvement in symptoms so as ready for discharge Improvement in  symptoms so as ready for discharge   Short Term Goals: Ability to identify changes in lifestyle to reduce recurrence of condition will improve Ability to verbalize feelings will improve Ability to demonstrate self-control will improve Ability to identify and develop effective coping behaviors will improve Compliance with prescribed medications will improve Ability to identify triggers associated with substance abuse/mental health issues will improve Ability to identify changes in lifestyle to reduce recurrence of condition will improve Ability to verbalize feelings will improve Ability to demonstrate self-control will improve Ability to identify and develop effective coping behaviors will improve Compliance with prescribed medications will improve Ability to identify triggers associated with substance abuse/mental health issues will improve     Medication Management: Evaluate patient's response, side effects, and tolerance of medication regimen.  Therapeutic Interventions: 1 to 1 sessions, Unit Group sessions and Medication administration.  Evaluation of Outcomes: Not Progressing   RN Treatment Plan for Primary Diagnosis: Psychosis (HCC) Long Term Goal(s): Knowledge of disease and therapeutic regimen to maintain health will improve  Short Term Goals: Ability to demonstrate self-control, Ability to participate in decision making will improve, Ability to verbalize feelings will improve, Ability to disclose and discuss suicidal ideas and Ability to identify and develop effective coping behaviors will improve  Medication Management: RN will administer medications as ordered by provider, will assess and evaluate patient's response and provide education to patient for prescribed medication. RN will report any adverse and/or side effects to prescribing provider.  Therapeutic Interventions: 1 on 1 counseling sessions, Psychoeducation, Medication administration, Evaluate responses to treatment,  Monitor vital signs and CBGs as ordered, Perform/monitor CIWA, COWS, AIMS and Fall Risk screenings as ordered, Perform wound care treatments as ordered.  Evaluation of Outcomes: Not Progressing   LCSW Treatment Plan for Primary Diagnosis: Psychosis (HCC) Long Term Goal(s): Safe transition to appropriate next level of care at discharge, Engage patient in therapeutic group addressing interpersonal concerns.  Short Term Goals: Engage patient in aftercare planning with referrals and resources, Increase social support, Increase ability to appropriately verbalize feelings, Increase emotional regulation, Identify triggers associated with mental health/substance abuse issues and Increase skills for wellness and recovery  Therapeutic Interventions: Assess for all discharge needs, 1 to 1 time with Social worker, Explore available resources and support systems, Assess for adequacy in community support network, Educate family and significant other(s) on suicide prevention, Complete Psychosocial Assessment, Interpersonal group therapy.  Evaluation of Outcomes: Not Progressing   Progress in Treatment: Attending groups: No. Participating in groups: No. Taking medication as prescribed: Yes. Toleration medication: Yes. Family/Significant other contact made: No, will contact:  once permission is given. Patient understands diagnosis: No. Discussing patient identified problems/goals with staff: Yes. Medical problems stabilized or resolved: Yes. Denies suicidal/homicidal ideation: Yes. Issues/concerns per patient self-inventory: No. Other: none  New problem(s) identified: No, Describe:  none  New Short Term/Long Term Goal(s): detox, elimination of symptoms of psychosis, medication management for mood stabilization; elimination of SI thoughts; development of comprehensive mental wellness/sobriety plan.  Patient Goals:  Patient attended treatment team, however, did not engage in discussion.  When asked if  she had a goal she shook  her head 'no'.  Discharge Plan or Barriers: Patient reported at assessment plans to return to the home with her mother and sisters. She was declining aftercare at this time.   Reason for Continuation of Hospitalization: Anxiety Delusions  Hallucinations Medication stabilization Suicidal ideation  Estimated Length of Stay:  1-7 days   Recreational Therapy: Patient Stressors: N/A Patient Goal: Patient will engage in groups without prompting or encouragement from LRT x3 group sessions within 5 recreation therapy group sessions.   Attendees: Patient:  Robin Wood 09/28/2020 9:32 AM  Physician: Dr. Neale Burly, MD 09/28/2020 9:32 AM  Nursing: Hayes Ludwig, RN 09/28/2020 9:32 AM  RN Care Manager: 09/28/2020 9:32 AM  Social Worker: Penni Homans, MSW, LCSW 09/28/2020 9:32 AM  Recreational Therapist: Garret Reddish, Drue Flirt, LRT 09/28/2020 9:32 AM  Other: Jillyn Hidden, LCSW 09/28/2020 9:32 AM  Other:  09/28/2020 9:32 AM  Other: 09/28/2020 9:32 AM    Scribe for Treatment Team: Harden Mo, LCSW 09/28/2020 9:32 AM

## 2020-09-29 DIAGNOSIS — F333 Major depressive disorder, recurrent, severe with psychotic symptoms: Secondary | ICD-10-CM | POA: Diagnosis present

## 2020-09-29 MED ORDER — NIFEDIPINE ER 30 MG PO TB24: 30.0000 mg | ORAL_TABLET | Freq: Every day | ORAL | 1 refills | Status: DC

## 2020-09-29 MED ORDER — RISPERIDONE 2 MG PO TABS: 2.0000 mg | ORAL_TABLET | Freq: Every day | ORAL | 1 refills | Status: DC

## 2020-09-29 MED ORDER — SERTRALINE HCL 50 MG PO TABS: 50.0000 mg | ORAL_TABLET | Freq: Every day | ORAL | 1 refills | Status: DC

## 2020-09-29 MED ORDER — TRAZODONE HCL 100 MG PO TABS: 100.0000 mg | ORAL_TABLET | Freq: Every evening | ORAL | 1 refills | Status: DC | PRN

## 2020-09-29 MED ORDER — HYDROXYZINE HCL 50 MG PO TABS: 50.0000 mg | ORAL_TABLET | Freq: Three times a day (TID) | ORAL | 1 refills | Status: DC | PRN

## 2020-09-29 NOTE — Progress Notes (Signed)
Recreation Therapy Notes  Date: 09/29/2020  Time: 9:30 am   Location: Craft room  Behavioral response: Appropriate  Intervention Topic: Leisure  Discussion/Intervention:  Group content today was focused on leisure. The group defined what leisure is and some positive leisure activities they participate in. Individuals identified the difference between good and bad leisure. Participants expressed how they feel after participating in the leisure of their choice. The group discussed how they go about picking a leisure activity and if others are involved in their leisure activities. The patient stated how many leisure activities they have to choose from and reasons why it is important to have leisure time. Individuals participated in the intervention "Exploration of Leisure" where they had a chance to identify new leisure activities as well as benefits of leisure.  Clinical Observations/Feedback: Patient came to group and defined leisure as things you do in your free time.She explained that she like top cook, paint and meditate in her free time. Participant expressed that having time to complete an activity is important when participating in leisure. Individual was social with peers and staff while participating in the intervention.  Ercel Pepitone LRT/CTRS         Ophie Burrowes 09/29/2020 2:18 PM

## 2020-09-29 NOTE — [facility] Suicide Risk Assessment (Signed)
[facility] Discharge Suicide Risk Assessment   Principal Problem: MDD (major depressive disorder), recurrent, severe, with psychosis (HCC) Discharge Diagnoses: Principal Problem:   MDD (major depressive disorder), recurrent, severe, with psychosis (HCC) Active Problems:   Cocaine use disorder (HCC)   Total Time spent with patient: 30 minutes  Musculoskeletal: Strength & Muscle Tone: within normal limits Gait & Station: normal Patient leans: N/A  Psychiatric Specialty Exam: Review of Systems  Constitutional: Negative for appetite change and fatigue.  HENT: Negative for rhinorrhea and sore throat.   Eyes: Negative for photophobia and visual disturbance.  Respiratory: Negative for cough and shortness of breath.   Cardiovascular: Negative for chest pain and palpitations.  Gastrointestinal: Negative for constipation, diarrhea, nausea and vomiting.  Endocrine: Negative for cold intolerance and heat intolerance.  Genitourinary: Negative for difficulty urinating and dysuria.  Musculoskeletal: Negative for arthralgias and back pain.  Skin: Negative for rash and wound.  Allergic/Immunologic: Negative for food allergies and immunocompromised state.  Neurological: Negative for dizziness and headaches.  Hematological: Negative for adenopathy. Does not bruise/bleed easily.  Psychiatric/Behavioral: Negative for behavioral problems, confusion, dysphoric mood, hallucinations and suicidal ideas. The patient is not nervous/anxious and is not hyperactive.     Blood pressure 94/81, pulse 92, temperature 98.6 F (37 C), temperature source Oral, resp. rate 17, height 5\' 2"  (1.575 m), weight 38.6 kg, SpO2 95 %, unknown if currently breastfeeding.Body mass index is 15.55 kg/m.  General Appearance: Fairly Groomed  ::  Good  Speech:  Clear and Coherent  Volume:  Normal  Mood:  Euthymic  Affect:  Appropriate and Congruent  Thought Process:  Coherent and Linear  Orientation:  Full (Time, Place,  and Person)  Thought Content:  Logical  Suicidal Thoughts:  No  Homicidal Thoughts:  No  Memory:  Immediate;   Fair Recent;   Fair Remote;   Fair  Judgement:  Intact  Insight:  Fair  Psychomotor Activity:  Normal  Concentration:  Fair  Recall:  [phone] of Knowledge:Fair  Language: Fair  Akathisia:  Negative  Handed:  Right  AIMS (if indicated):     Assets:  Communication Skills Desire for Improvement Financial Resources/Insurance Housing Intimacy Physical Health Resilience Social Support  Sleep:  Number of Hours: 8.5  Cognition: WNL  ADL's:  Intact   Mental Status Per Nursing Assessment::   On Admission:  NA  Demographic Factors:  NA  Loss Factors: NA  Historical Factors: NA  Risk Reduction Factors:   Responsible for children under 21 years of age, Sense of responsibility to family, Living with another person, especially a relative, Positive social support, Positive therapeutic relationship and Positive coping skills or problem solving skills  Continued Clinical Symptoms:  Depression:   Recent sense of peace/wellbeing Alcohol/Substance Abuse/Dependencies  Cognitive Features That Contribute To Risk:  None    Suicide Risk:  Minimal: No identifiable suicidal ideation.  Patients presenting with no risk factors but with morbid ruminations; may be classified as minimal risk based on the severity of the depressive symptoms    Plan Of Care/Follow-up recommendations:  Activity:  as tolerated Diet:  regular diet  15, MD 09/30/2020, 9:54 AM

## 2020-09-29 NOTE — [facility] Group Notes (Signed)
[facility] Group Notes:  (Nursing/MHT/Case Management/Adjunct)  Date:  09/29/2020  Time:  10:55 PM  Type of Therapy:  Group Therapy  Participation Level:  Active  Participation Quality:  Appropriate  Affect:  Appropriate  Cognitive:  Alert  Insight:  Good  Engagement in Group:  Engaged and try to socialize stay out of room.  Modes of Intervention:  Support  Summary of Progress/Problems:  Robin Wood 09/29/2020, 10:55 PM

## 2020-09-29 NOTE — Progress Notes (Signed)
[facility] MD Progress Note  09/29/2020 2:44 PM Robin Wood  MRN:  [id number]   Subjective:  Patient seen one-on-one this morning. She is significantly brighter today, and engages with this provider. She admits to feeling overwhelmed and anxious prior to admission. She feels she is calmer now, and sleeping better. She has been visible on the unit, and participating well in groups. She no longer appears internally preoccupied. She denies suicidal ideations, homicidal ideations, visual hallucinations, and auditory hallucinations. She is also now willing to go to [facility] for outpatient follow-up for psychiatric care. Based on improvement seen today she will likely be able to discharge tomorrow with close follow-up.   Principal Problem: Psychosis (HCC) Diagnosis: Principal Problem:   Psychosis (HCC) Active Problems:   Cocaine abuse (HCC)  Total Time spent with patient: 30 minutes  Past Psychiatric History: According to the mother the patient has had previous hospitalizations at [facility] and at another hospital. She suspects the patient may have schizophrenia but does not know for certain. No known history of suicide attempts or violence  Past Medical History:  Past Medical History:  Diagnosis Date  . Anxiety   . Insomnia     Past Surgical History:  Procedure Laterality Date  . CESAREAN SECTION N/A 07/05/2018   Procedure: CESAREAN SECTION;  Surgeon: Conard Novak, MD;  Location: [facility] ORS;  Service: Obstetrics;  Laterality: N/A;  . NO PAST SURGERIES     Family History: History reviewed. No pertinent family history. Family Psychiatric  History:  Mother reports that both sides of the family have had people with mental health problems. Patient's father reportedly has schizophrenia Social History:  Social History   Substance and Sexual Activity  Alcohol Use Yes   Comment: "not frequently"     Social History   Substance and Sexual Activity  Drug Use No    Social History    Socioeconomic History  . Marital status: Single    Spouse name: Not on file  . Number of children: 0  . Years of education: Not on file  . Highest education level: 11th grade  Occupational History  . Not on file  Tobacco Use  . Smoking status: Current Some Day Smoker    Types: Cigarettes    Last attempt to quit: 12/17/2017    Years since quitting: 2.7  . Smokeless tobacco: Never Used  Vaping Use  . Vaping Use: Every day  Substance and Sexual Activity  . Alcohol use: Yes    Comment: "not frequently"  . Drug use: No  . Sexual activity: Yes    Birth control/protection: Injection    Comment: Depo  Other Topics Concern  . Not on file  Social History Narrative  . Not on file   Social Determinants of Health   Financial Resource Strain:   . Difficulty of Paying Living Expenses: Not on file  Food Insecurity:   . Worried About Programme researcher, broadcasting/film/video in the Last Year: Not on file  . Ran Out of Food in the Last Year: Not on file  Transportation Needs:   . Lack of Transportation (Medical): Not on file  . Lack of Transportation (Non-Medical): Not on file  Physical Activity:   . Days of Exercise per Week: Not on file  . Minutes of Exercise per Session: Not on file  Stress:   . Feeling of Stress : Not on file  Social Connections:   . Frequency of Communication with Friends and Family: Not on file  .  Frequency of Social Gatherings with Friends and Family: Not on file  . Attends Religious Services: Not on file  . Active Member of Clubs or Organizations: Not on file  . Attends BankerClub or Organization Meetings: Not on file  . Marital Status: Not on file   Additional Social History:       Sleep: Fair  Appetite:  Poor  Current Medications: Current Facility-Administered Medications  Medication Dose Route Frequency Provider Last Rate Last Admin  . acetaminophen (TYLENOL) tablet 650 mg  650 mg Oral Q6H PRN Clapacs, John T, MD      . alum & mag hydroxide-simeth (MAALOX/MYLANTA)  200-200-20 MG/5ML suspension 30 mL  30 mL Oral Q4H PRN Clapacs, John T, MD      . hydrOXYzine (ATARAX/VISTARIL) tablet 50 mg  50 mg Oral TID PRN Clapacs, Jackquline DenmarkJohn T, MD      . LORazepam (ATIVAN) tablet 2 mg  2 mg Oral Q6H PRN Jesse SansFreeman, Marleigh Kaylor M, MD   2 mg at 09/28/20 2120  . magnesium hydroxide (MILK OF MAGNESIA) suspension 30 mL  30 mL Oral Daily PRN Clapacs, John T, MD      . NIFEdipine (PROCARDIA-XL/NIFEDICAL-XL) 24 hr tablet 30 mg  30 mg Oral Daily Clapacs, John T, MD      . risperiDONE (RISPERDAL) tablet 2 mg  2 mg Oral QHS Jesse SansFreeman, Amay Mijangos M, MD   2 mg at 09/28/20 2119  . sertraline (ZOLOFT) tablet 50 mg  50 mg Oral Daily Jesse SansFreeman, Casen Pryor M, MD   50 mg at 09/29/20 [id number]  . traZODone (DESYREL) tablet 100 mg  100 mg Oral QHS PRN Clapacs, Jackquline DenmarkJohn T, MD   100 mg at 09/28/20 2119    Lab Results:  No results found for this or any previous visit (from the past 48 hour(s)).  Blood Alcohol level:  Lab Results  Component Value Date   ETH <10 09/25/2020   ETH <5 05/23/2015    Metabolic Disorder Labs: No results found for: HGBA1C, MPG No results found for: PROLACTIN Lab Results  Component Value Date   CHOL 152 09/26/2020   TRIG 46 09/26/2020   HDL 50 09/26/2020   CHOLHDL 3.0 09/26/2020   VLDL 9 09/26/2020   LDLCALC 93 09/26/2020    Physical Findings: AIMS: Facial and Oral Movements Muscles of Facial Expression: None, normal Lips and Perioral Area: None, normal Jaw: None, normal Tongue: None, normal,Extremity Movements Upper (arms, wrists, hands, fingers): None, normal Lower (legs, knees, ankles, toes): None, normal, Trunk Movements Neck, shoulders, hips: None, normal, Overall Severity Severity of abnormal movements (highest score from questions above): None, normal Incapacitation due to abnormal movements: None, normal Patient's awareness of abnormal movements (rate only patient's report): No Awareness, Dental Status Current problems with teeth and/or dentures?: No Does patient usually wear  dentures?: No  CIWA:    COWS:     Musculoskeletal: Strength & Muscle Tone: within normal limits Gait & Station: normal Patient leans: N/A  Psychiatric Specialty Exam: Physical Exam Vitals and nursing note reviewed.  Constitutional:      Appearance: Normal appearance.  HENT:     Head: Normocephalic and atraumatic.     Right Ear: External ear normal.     Left Ear: External ear normal.     Nose: Nose normal.     Mouth/Throat:     Mouth: Mucous membranes are moist.     Pharynx: Oropharynx is clear.  Eyes:     Extraocular Movements: Extraocular movements intact.     Conjunctiva/sclera: Conjunctivae  normal.     Pupils: Pupils are equal, round, and reactive to light.  Cardiovascular:     Rate and Rhythm: Normal rate.     Pulses: Normal pulses.  Pulmonary:     Effort: Pulmonary effort is normal.     Breath sounds: Normal breath sounds.  Abdominal:     General: Abdomen is flat.     Palpations: Abdomen is soft.  Musculoskeletal:        General: No swelling. Normal range of motion.     Cervical back: Normal range of motion and neck supple.  Skin:    General: Skin is warm and dry.  Neurological:     General: No focal deficit present.     Mental Status: She is alert and oriented to person, place, and time.  Psychiatric:        Attention and Perception: Attention and perception normal.        Mood and Affect: Affect normal. Mood is anxious. Affect is not tearful.        Speech: Speech normal. Speech is not delayed.        Behavior: Behavior is not agitated. Behavior is cooperative.        Thought Content: Thought content is not paranoid. Thought content does not include homicidal or suicidal ideation.        Cognition and Memory: Cognition and memory normal.        Judgment: Judgment normal.     Review of Systems  Constitutional: Positive for appetite change. Negative for fatigue.  HENT: Negative for rhinorrhea and sore throat.   Eyes: Negative for photophobia and visual  disturbance.  Respiratory: Negative for cough and shortness of breath.   Cardiovascular: Negative for chest pain and palpitations.  Gastrointestinal: Negative for constipation, diarrhea, nausea and vomiting.  Endocrine: Negative for cold intolerance and heat intolerance.  Genitourinary: Negative for difficulty urinating and dysuria.  Musculoskeletal: Negative for arthralgias and back pain.  Skin: Negative for rash and wound.  Allergic/Immunologic: Negative for food allergies and immunocompromised state.  Neurological: Negative for dizziness and headaches.  Hematological: Negative for adenopathy. Does not bruise/bleed easily.  Psychiatric/Behavioral: Negative for agitation, confusion, dysphoric mood, sleep disturbance and suicidal ideas. The patient is nervous/anxious.     Blood pressure 93/67, pulse (!) 135, temperature 98.5 F (36.9 C), temperature source Oral, resp. rate 18, height 5\' 2"  (1.575 m), weight 38.6 kg, SpO2 100 %, unknown if currently breastfeeding.Body mass index is 15.55 kg/m.  General Appearance: Casual and Fairly Groomed  Eye Contact:  Good  Speech:  Clear and Coherent  Volume:  Decreased  Mood:  Anxious  Affect:  Appropriate  Thought Process:  Coherent  Orientation:  Full (Time, Place, and Person)  Thought Content:  Logical  Suicidal Thoughts:  No  Homicidal Thoughts:  No  Memory:  Immediate;   Fair Recent;   Fair Remote;   Fair  Judgement:  Fair  Insight:  Fair  Psychomotor Activity:  Normal  Concentration:  Concentration: Fair and Attention Span: Fair  Recall:  of Knowledge:  Fair  Language:  Fair  Akathisia:  Negative  Handed:  Right  AIMS (if indicated):     Assets:  Desire for Improvement Financial Resources/Insurance Housing Intimacy Resilience Social Support  ADL's:  Intact  Cognition:  WNL  Sleep:  Number of Hours: 6.75     Treatment Plan Summary: Daily contact with patient to assess and evaluate symptoms and progress in  treatment and Medication management.  Continue Risperdal to 2 mg QHS for continued psychosis. Continue Sertraline 50 mg daily for depression and anxiety. Ativan 2 mg Q6hr PRN anxiety.   Jesse Sans, MD 09/29/2020, 2:44 PM

## 2020-09-29 NOTE — Discharge Summary (Signed)
Physician Discharge Summary Note  Patient:  Robin Wood is an 24 y.o., female MRN:  [id number] DOB:  06/14/1996 Patient phone:  [phone] (home)  Patient address:   [address] [place] [address],  Total Time spent with patient: 30 minutes  Date of Admission:  09/26/2020 Date of Discharge: 09/30/2020  Reason for Admission:  Acute psychosis and inability to care for herself  Principal Problem: MDD (major depressive disorder), recurrent, severe, with psychosis (HCC) Discharge Diagnoses: Principal Problem:   MDD (major depressive disorder), recurrent, severe, with psychosis (HCC) Active Problems:   Cocaine use disorder [facility])   Past Psychiatric History: According to the mother the patient has had previous hospitalizations at [facility] and at another hospital. She suspects the patient may have schizophrenia but does not know for certain. No known history of suicide attempts or violence  Past Medical History:  Past Medical History:  Diagnosis Date  . Anxiety   . Insomnia     Past Surgical History:  Procedure Laterality Date  . CESAREAN SECTION N/A 07/05/2018   Procedure: CESAREAN SECTION;  Surgeon: Conard Novak, MD;  Location: [facility] ORS;  Service: Obstetrics;  Laterality: N/A;  . NO PAST SURGERIES     Family History: History reviewed. No pertinent family history. Family Psychiatric  History: Mother reports that both sides of the family have had people with mental health problems. Patient's father reportedly has schizophrenia Social History:  Social History   Substance and Sexual Activity  Alcohol Use Yes   Comment: "not frequently"     Social History   Substance and Sexual Activity  Drug Use No    Social History   Socioeconomic History  . Marital status: Single    Spouse name: Not on file  . Number of children: 0  . Years of education: Not on file  . Highest education level: 11th grade  Occupational History  . Not on file  Tobacco Use  .  Smoking status: Current Some Day Smoker    Types: Cigarettes    Last attempt to quit: 12/17/2017    Years since quitting: 2.7  . Smokeless tobacco: Never Used  Vaping Use  . Vaping Use: Every day  Substance and Sexual Activity  . Alcohol use: Yes    Comment: "not frequently"  . Drug use: No  . Sexual activity: Yes    Birth control/protection: Injection    Comment: Depo  Other Topics Concern  . Not on file  Social History Narrative  . Not on file   Social Determinants of Health   Financial Resource Strain:   . Difficulty of Paying Living Expenses: Not on file  Food Insecurity:   . Worried About Programme researcher, broadcasting/film/video in the Last Year: Not on file  . Ran Out of Food in the Last Year: Not on file  Transportation Needs:   . Lack of Transportation (Medical): Not on file  . Lack of Transportation (Non-Medical): Not on file  Physical Activity:   . Days of Exercise per Week: Not on file  . Minutes of Exercise per Session: Not on file  Stress:   . Feeling of Stress : Not on file  Social Connections:   . Frequency of Communication with Friends and Family: Not on file  . Frequency of Social Gatherings with Friends and Family: Not on file  . Attends Religious Services: Not on file  . Active Member of Clubs or Organizations: Not on file  . Attends Banker Meetings: Not  on file  . Marital Status: Not on file    Hospital Course:  Patient admitted for acute psychosis and inability to care for self. When first admitted she was trembling, anxious, paranoid, exhibiting thought blocking and delayed speech, and was internally preoccupied. She had an unclear psychiatric history reported by her mother, but she was previously stabilized on Zoloft and Risperdal. Zoloft 50 mg was started, and Risperdal was titrated to 2 mg nightly. Her psychosis rapidly improved as her mood began to improve. Patient notes that these "mental break downs" happen in periods of severe depression and stress. Her  diagnosis is most consistent with major depressive disorder, recurrent, severe, with psychotic features. She does not report a history of psychosis outside of a depressive episode. By the end of the hospital stay patient was out on the unit, soicable with peers, and participating in groups. She noted that the medications had relieved a lot of anxiety, and was aware they were beneficial to her. She stated she knew she needed to remain on medications, and go to follow-up in order to stay well. She denied suicidal ideations, homicidal ideations, visual hallucinations, and auditory hallucinations. At time of discharge patient and treatment team felt she was safe to discharge with close outpatient follow-up.   Physical Findings: AIMS: Facial and Oral Movements Muscles of Facial Expression: None, normal Lips and Perioral Area: None, normal Jaw: None, normal Tongue: None, normal,Extremity Movements Upper (arms, wrists, hands, fingers): None, normal Lower (legs, knees, ankles, toes): None, normal, Trunk Movements Neck, shoulders, hips: None, normal, Overall Severity Severity of abnormal movements (highest score from questions above): None, normal Incapacitation due to abnormal movements: None, normal Patient's awareness of abnormal movements (rate only patient's report): No Awareness, Dental Status Current problems with teeth and/or dentures?: No Does patient usually wear dentures?: No  CIWA:    COWS:     Musculoskeletal: Strength & Muscle Tone: within normal limits Gait & Station: normal Patient leans: N/A  Psychiatric Specialty Exam: Physical Exam Vitals and nursing note reviewed.  Constitutional:      Appearance: Normal appearance.  HENT:     Head: Normocephalic and atraumatic.     Right Ear: External ear normal.     Left Ear: External ear normal.     Nose: Nose normal.     Mouth/Throat:     Mouth: Mucous membranes are moist.     Pharynx: Oropharynx is clear.  Eyes:     Extraocular  Movements: Extraocular movements intact.     Conjunctiva/sclera: Conjunctivae normal.     Pupils: Pupils are equal, round, and reactive to light.  Cardiovascular:     Rate and Rhythm: Normal rate.     Pulses: Normal pulses.  Pulmonary:     Effort: Pulmonary effort is normal.     Breath sounds: Normal breath sounds.  Abdominal:     General: Abdomen is flat.     Palpations: Abdomen is soft.  Musculoskeletal:        General: No swelling. Normal range of motion.     Cervical back: Normal range of motion and neck supple.  Skin:    General: Skin is warm and dry.  Neurological:     General: No focal deficit present.     Mental Status: She is alert and oriented to person, place, and time.  Psychiatric:        Mood and Affect: Mood normal.        Behavior: Behavior normal.  Thought Content: Thought content normal.        Judgment: Judgment normal.     Review of Systems  Constitutional: Negative for appetite change and fatigue.  HENT: Negative for rhinorrhea and sore throat.   Eyes: Negative for photophobia and visual disturbance.  Respiratory: Negative for cough and shortness of breath.   Cardiovascular: Negative for chest pain and palpitations.  Gastrointestinal: Negative for constipation, diarrhea, nausea and vomiting.  Endocrine: Negative for cold intolerance and heat intolerance.  Genitourinary: Negative for difficulty urinating and dysuria.  Musculoskeletal: Negative for arthralgias and back pain.  Skin: Negative for rash and wound.  Allergic/Immunologic: Negative for food allergies and immunocompromised state.  Neurological: Negative for dizziness and headaches.  Hematological: Negative for adenopathy. Does not bruise/bleed easily.  Psychiatric/Behavioral: Negative for behavioral problems, confusion, dysphoric mood, hallucinations and suicidal ideas. The patient is not nervous/anxious and is not hyperactive.     Blood pressure 94/81, pulse 92, temperature 98.6 F (37  C), temperature source Oral, resp. rate 17, height 5\' 2"  (1.575 m), weight 38.6 kg, SpO2 95 %, unknown if currently breastfeeding.Body mass index is 15.55 kg/m.  General Appearance: Fairly Groomed  ::  Good  Speech:  Clear and Coherent  Volume:  Normal  Mood:  Euthymic  Affect:  Appropriate and Congruent  Thought Process:  Coherent and Linear  Orientation:  Full (Time, Place, and Person)  Thought Content:  Logical  Suicidal Thoughts:  No  Homicidal Thoughts:  No  Memory:  Immediate;   Fair Recent;   Fair Remote;   Fair  Judgement:  Intact  Insight:  Fair  Psychomotor Activity:  Normal  Concentration:  Fair  Recall:  [id number] of Knowledge:Fair  Language: Fair  Akathisia:  Negative  Handed:  Right  AIMS (if indicated):     Assets:  Communication Skills Desire for Improvement Financial Resources/Insurance Housing Intimacy Physical Health Resilience Social Support  Sleep:  Number of Hours: 6.75  Cognition: WNL  ADL's:  Intact           Has this patient used any form of tobacco in the last 30 days? (Cigarettes, Smokeless Tobacco, Cigars, and/or Pipes)  No  Blood Alcohol level:  Lab Results  Component Value Date   ETH <10 09/25/2020   ETH <5 05/23/2015    Metabolic Disorder Labs:  No results found for: HGBA1C, MPG No results found for: PROLACTIN Lab Results  Component Value Date   CHOL 152 09/26/2020   TRIG 46 09/26/2020   HDL 50 09/26/2020   CHOLHDL 3.0 09/26/2020   VLDL 9 09/26/2020   LDLCALC 93 09/26/2020    See Psychiatric Specialty Exam and Suicide Risk Assessment completed by Attending Physician prior to discharge.  Discharge destination:  Home  Is patient on multiple antipsychotic therapies at discharge:  No   Has Patient had three or more failed trials of antipsychotic monotherapy by history:  No  Recommended Plan for Multiple Antipsychotic Therapies: NA  Discharge Instructions    Diet general   Complete by: As directed     Increase activity slowly   Complete by: As directed      Allergies as of 09/30/2020      Reactions   Penicillins Hives      Medication List    STOP taking these medications   oxyCODONE-acetaminophen 5-325 MG tablet Commonly known as: PERCOCET/ROXICET     TAKE these medications     Indication  ferrous sulfate 325 (65 FE) MG tablet Take 1  tablet (325 mg total) by mouth 2 (two) times daily with a meal.  Indication: Iron Deficiency   hydrOXYzine 50 MG tablet Commonly known as: ATARAX/VISTARIL Take 1 tablet (50 mg total) by mouth 3 (three) times daily as needed for anxiety.  Indication: Feeling Anxious   ibuprofen 600 MG tablet Commonly known as: ADVIL Take 1 tablet (600 mg total) by mouth every 6 (six) hours.  Indication: Pain   medroxyPROGESTERone 150 MG/ML injection Commonly known as: DEPO-PROVERA Inject 1 mL (150 mg total) into the muscle every 3 (three) months.  Indication: Birth Control Treatment   NIFEdipine 30 MG 24 hr tablet Commonly known as: ADALAT CC Take 1 tablet (30 mg total) by mouth daily.  Indication: High Blood Pressure Disorder   prenatal multivitamin Tabs tablet Take 1 tablet by mouth daily at 12 noon.  Indication: Vitamin Deficiency   risperiDONE 2 MG tablet Commonly known as: RISPERDAL Take 1 tablet (2 mg total) by mouth at bedtime.  Indication: Schizophrenia   sertraline 50 MG tablet Commonly known as: ZOLOFT Take 1 tablet (50 mg total) by mouth daily.  Indication: Major Depressive Disorder   traZODone 100 MG tablet Commonly known as: DESYREL Take 1 tablet (100 mg total) by mouth at bedtime as needed for sleep.  Indication: Trouble Sleeping        Follow-up recommendations:  Activity:  as tolerated Diet:  regular diet  Comments:  30-day supply of medication with one refill sent to [facility] in [place], [place] per patient request.   Signed: Jesse Sans, MD 09/30/2020, 9:55 AM

## 2020-09-29 NOTE — Progress Notes (Signed)
Pt is alert and oriented to person, place, time and situation. Pt is calm, cooperative, denies suicidal and homicidal ideation, denies hallucinations, denies feelings of depression, reports anxiety, rates it 7/10 on a 0-10 scale, 10 being worst. Pt's thoughts are clearer today, less thought blocking is noted, less tearfulness noted, mood is less labile. Pt's affect is more animated, smiled once on contact, thoughts are a little more organized. Pt is able to verbalize reasons for admission, highlighting some of her symptoms of psychosis that she remembers, and pt was able to discuss, and verbalized understanding of importance of medication compliance. Pt talked on the phone with her significant other, spends time resting quietly in her room, some time in the dayroom, appetite is fair. No distress noted, none reported will continue to monitor pt per Q15 minute face checks and monitor for safety and progress.

## 2020-09-29 NOTE — [facility] Group Notes (Signed)
LCSW Group Therapy Note  09/29/2020 2:39 PM  Type of Therapy/Topic:  Group Therapy:  Balance in Life  Participation Level:  Minimal  Description of Group:    This group will address the concept of balance and how it feels and looks when one is unbalanced. Patients will be encouraged to process areas in their lives that are out of balance and identify reasons for remaining unbalanced. Facilitators will guide patients in utilizing problem-solving interventions to address and correct the stressor making their life unbalanced. Understanding and applying boundaries will be explored and addressed for obtaining and maintaining a balanced life. Patients will be encouraged to explore ways to assertively make their unbalanced needs known to significant others in their lives, using other group members and facilitator for support and feedback.  Therapeutic Goals: 1. Patient will identify two or more emotions or situations they have that consume much of in their lives. 2. Patient will identify signs/triggers that life has become out of balance:  3. Patient will identify two ways to set boundaries in order to achieve balance in their lives:  4. Patient will demonstrate ability to communicate their needs through discussion and/or role plays  Summary of Patient Progress: Pt was present in the group and participated. When she did speak it was pertinent to the discussion at hand and presented with slight insight.  Therapeutic Modalities:   Cognitive Behavioral Therapy Solution-Focused Therapy Assertiveness Training  [employer]. Algis Greenhouse, MSW, LCSW, LCAS 09/29/2020 2:39 PM

## 2020-09-30 NOTE — Progress Notes (Signed)
Patient discharged at 1430 to current residency. Calm and cooperative upon discharge. Denies SI/HI/AVH. Discharge education provided and patient verbalized understanding.

## 2020-09-30 NOTE — Progress Notes (Signed)
Recreation Therapy Notes  INPATIENT RECREATION TR PLAN  Patient Details Name: Robin Wood MRN: [id number] DOB: 20-Aug-1996 Today's Date: 09/30/2020  Rec Therapy Plan Is patient appropriate for Therapeutic Recreation?: Yes Treatment times per week: at least 3 Estimated Length of Stay: 5-7 days TR Treatment/Interventions: Group participation (Comment)  Discharge Criteria Pt will be discharged from therapy if:: Discharged Treatment plan/goals/alternatives discussed and agreed upon by:: Patient/family  Discharge Summary Short term goals set: Patient will engage in groups without prompting or encouragement from LRT x3 group sessions within 5 recreation therapy group sessions Short term goals met: Complete Progress toward goals comments: Groups attended Which groups?: Other (Comment), Leisure education (Emotions, Problem Solving) Reason goals not met: N/A Therapeutic equipment acquired: N/A Reason patient discharged from therapy: Discharge from hospital Pt/family agrees with progress & goals achieved: Yes Date patient discharged from therapy: 09/30/20   Hudson Lehmkuhl 09/30/2020, 12:36 PM

## 2020-09-30 NOTE — [facility] Group Notes (Signed)
LCSW Group Therapy Note  09/30/2020 2:47 PM  Type of Therapy/Topic:  Group Therapy:  Emotion Regulation  Participation Level:  Active   Description of Group:   The purpose of this group is to assist patients in learning to regulate negative emotions and experience positive emotions. Patients will be guided to discuss ways in which they have been vulnerable to their negative emotions. These vulnerabilities will be juxtaposed with experiences of positive emotions or situations, and patients will be challenged to use positive emotions to combat negative ones. Special emphasis will be placed on coping with negative emotions in conflict situations, and patients will process healthy conflict resolution skills.  Therapeutic Goals: 1. Patient will identify two positive emotions or experiences to reflect on in order to balance out negative emotions 2. Patient will label two or more emotions that they find the most difficult to experience 3. Patient will demonstrate positive conflict resolution skills through discussion and/or role plays  Summary of Patient Progress: Patient was present in group.  Patient was an active participant.  Patient was able to share her helpful and healthy coping skills. Patient participated in discussion on what barriers can prevent someone from using a coping skills.  She was able to engage in discussion on healthy vs unhealthy coping skills.    Therapeutic Modalities:   Cognitive Behavioral Therapy Feelings Identification Dialectical Behavioral Therapy  Robin Wood, MSW, LCSW 09/30/2020 2:47 PM

## 2020-09-30 NOTE — Plan of Care (Signed)
Active in the milieu. Expressing readiness for discharge.

## 2020-09-30 NOTE — Progress Notes (Signed)
  [facility] Adult Case Management Discharge Plan :  Will you be returning to the same living situation after discharge:  Yes,  pt reports that she is returning home.  At discharge, do you have transportation home?: Yes,  pt reports that she will a peer provide transportation. Do you have the ability to pay for your medications: Yes,  [place] Medicaid.  Release of information consent forms completed and in the chart;  Patient's signature needed at discharge.  Patient to Follow up at:  Follow-up Information    [facility] Follow up.   Why: Appointment is scheduled for 10/05/2020 at 12:30PM. Please bring your mediactions to the appointment. Appointment is in person. Thanks!  Contact information: [address] [place] [address] [phone]               Next level of care provider has access to [facility] Link:no  Safety Planning and Suicide Prevention discussed: Yes,  SPE completed with the patient.  SPE attempts with collateral were attempted.      Has patient been referred to the Quitline?: Patient refused referral  Patient has been referred for addiction treatment: Pt. refused referral  Harden Mo, LCSW 09/30/2020, 10:17 AM

## 2020-09-30 NOTE — Plan of Care (Signed)
  Problem: Group Participation Goal: STG - Patient will engage in groups without prompting or encouragement from LRT x3 group sessions within 5 recreation therapy group sessions Description: STG - Patient will engage in groups without prompting or encouragement from LRT x3 group sessions within 5 recreation therapy group sessions Outcome: Completed/Met

## 2020-09-30 NOTE — Progress Notes (Signed)
Patient alert and oriented x 4, affect is flat, but she brightens upon approach no distress noted, she appears less anxious, interacting appropriately with peers and staff. Patient was offered emotional support and encouraged to attend evening wrap up group. Patient attended group and was complaint with evening medication regimen, 15 minutes safety checks maintained will continue to monitor.

## 2020-09-30 NOTE — Progress Notes (Signed)
Recreation Therapy Notes   Date: 09/30/2020  Time: 9:30 am   Location: Craft room  Behavioral response: Appropriate  Intervention Topic: Emotions   Discussion/Intervention:  Group content on today was focused on emotions. The group identified what emotions are and why it is important to have emotions. Patients expressed some positive and negative emotions. Individuals gave some past experiences on how they normally dealt with emotions in the past. The group described some positive ways to deal with emotions in the future. Patients participated in the intervention "Name the Megan Salon" where individuals were given a chance to experience different emotions.   Clinical Observations/Feedback: Patient came to group and defined emotions as how you feel on the inside. She explained that common emotions for her are sincerity, love and excitement. Participant expressed that emotions comes from your spirit and ORA.  Individual was social with peers and staff while participating in the intervention.  Keyen Marban LRT/CTRS         Doak Mah 09/30/2020 12:12 PM

## 2020-11-26 ENCOUNTER — Emergency Department
Admission: EM | Admit: 2020-11-26 | Discharge: 2020-11-27 | Disposition: A | Discharge status: Other Acute Inpatient Hospital | Payer: Medicaid Other | Attending: Emergency Medicine | Admitting: Emergency Medicine

## 2020-11-26 ENCOUNTER — Encounter: Payer: Self-pay | Admitting: Emergency Medicine

## 2020-11-26 DIAGNOSIS — R4689 Other symptoms and signs involving appearance and behavior: Secondary | ICD-10-CM | POA: Diagnosis not present

## 2020-11-26 DIAGNOSIS — R462 Strange and inexplicable behavior: Secondary | ICD-10-CM | POA: Diagnosis present

## 2020-11-26 DIAGNOSIS — Z20822 Contact with and (suspected) exposure to covid-19: Secondary | ICD-10-CM | POA: Insufficient documentation

## 2020-11-26 LAB — CBC
HCT: 42 % (ref 36.0–46.0)
Hemoglobin: 13.2 g/dL (ref 12.0–15.0)
MCH: 26.2 pg (ref 26.0–34.0)
MCHC: 31.4 g/dL (ref 30.0–36.0)
MCV: 83.3 fL (ref 80.0–100.0)
Platelets: 192 10*3/uL (ref 150–400)
RBC: 5.04 MIL/uL (ref 3.87–5.11)
RDW: 15.7 % — ABNORMAL HIGH (ref 11.5–15.5)
WBC: 6.5 10*3/uL (ref 4.0–10.5)
nRBC: 0 % (ref 0.0–0.2)

## 2020-11-26 LAB — URINE DRUG SCREEN, QUALITATIVE (ARMC ONLY)
Amphetamines, Ur Screen: NOT DETECTED
Barbiturates, Ur Screen: NOT DETECTED
Benzodiazepine, Ur Scrn: NOT DETECTED
Cannabinoid 50 Ng, Ur ~~LOC~~: POSITIVE — AB
Cocaine Metabolite,Ur ~~LOC~~: POSITIVE — AB
MDMA (Ecstasy)Ur Screen: NOT DETECTED
Methadone Scn, Ur: NOT DETECTED
Opiate, Ur Screen: NOT DETECTED
Phencyclidine (PCP) Ur S: NOT DETECTED
Tricyclic, Ur Screen: NOT DETECTED

## 2020-11-26 LAB — COMPREHENSIVE METABOLIC PANEL
ALT: 24 U/L (ref 0–44)
AST: 30 U/L (ref 15–41)
Albumin: 4.2 g/dL (ref 3.5–5.0)
Alkaline Phosphatase: 59 U/L (ref 38–126)
Anion gap: 11 (ref 5–15)
BUN: 14 mg/dL (ref 6–20)
CO2: 26 mmol/L (ref 22–32)
Calcium: 9.1 mg/dL (ref 8.9–10.3)
Chloride: 100 mmol/L (ref 98–111)
Creatinine, Ser: 0.73 mg/dL (ref 0.44–1.00)
GFR, Estimated: >60 mL/min (ref >60)
Glucose, Bld: 96 mg/dL (ref 70–99)
Potassium: 3.3 mmol/L — ABNORMAL LOW (ref 3.5–5.1)
Sodium: 137 mmol/L (ref 135–145)
Total Bilirubin: 0.4 mg/dL (ref 0.3–1.2)
Total Protein: 7.7 g/dL (ref 6.5–8.1)

## 2020-11-26 LAB — ACETAMINOPHEN LEVEL: Acetaminophen (Tylenol), Serum: <10 ug/mL — ABNORMAL LOW (ref 10–30)

## 2020-11-26 LAB — POC URINE PREG, ED
Preg Test, Ur: NEGATIVE
Preg Test, Ur: NEGATIVE

## 2020-11-26 LAB — ETHANOL: Alcohol, Ethyl (B): <10 mg/dL (ref <10)

## 2020-11-26 LAB — SALICYLATE LEVEL: Salicylate Lvl: <7 mg/dL — ABNORMAL LOW (ref 7.0–30.0)

## 2020-11-26 MED ORDER — TRAZODONE HCL 50 MG PO TABS
50.0000 mg | ORAL_TABLET | Freq: Every day | ORAL | Status: DC
  Administered 2020-11-27: 50 mg via ORAL
  Filled 2020-11-26: qty 1

## 2020-11-26 MED ORDER — MELATONIN 5 MG PO TABS
2.5000 mg | ORAL_TABLET | Freq: Every day | ORAL | Status: DC
  Administered 2020-11-27: 2.5 mg via ORAL
  Filled 2020-11-26: qty 1

## 2020-11-26 NOTE — ED Provider Notes (Signed)
[facility] Emergency Department Provider Note  ____________________________________________   Event Date/Time   First MD Initiated Contact with Patient 11/26/20 2136     (approximate)  I have reviewed the triage vital signs and the nursing notes.   HISTORY  Chief Complaint No chief complaint on file.   HPI Robin Wood is a 25 y.o. female the past medical history of insomnia, anxiety, and schizophrenia as well as major depressive disorder who presents accompanied by police after IVC paperwork was filled out by her mother with concerns for bizarre behavior and patient not taking her schizophrenia medication and throwing things around her home.  Patient refuses to clarify what exactly happened today or the events that led to her getting into argument with her mother.  She refuses to clarify if she is taking any medications or threw anything in her home today.  She denies any SI, HI, or hallucinations.  She denies any acute physical complaints including fevers, chills, headache, earache, sore throat, cough, nausea, vomiting, diarrhea, dysuria, rash or recent traumatic injuries.  She endorses occasional tobacco abuse and THC use but denies any other illicit drug use or EtOH use.  States she is not currently taking any medications.         Past Medical History:  Diagnosis Date  . Anxiety   . Insomnia     Patient Active Problem List   Diagnosis Date Noted  . MDD (major depressive disorder), recurrent, severe, with psychosis (HCC) 09/29/2020  . Cocaine use disorder (HCC) 09/26/2020  . Malpresentation of fetus, antepartum 07/05/2018  . Chlamydia infection affecting pregnancy in third trimester, antepartum 07/05/2018  . Trichomonas vaginitis 07/05/2018  . Labor and delivery, indication for care 07/03/2018  . Indication for care in labor and delivery, antepartum 04/16/2018    Past Surgical History:  Procedure Laterality Date  . CESAREAN SECTION  N/A 07/05/2018   Procedure: CESAREAN SECTION;  Surgeon: Conard Novak, MD;  Location: [facility] ORS;  Service: Obstetrics;  Laterality: N/A;  . NO PAST SURGERIES      Prior to Admission medications   Medication Sig Start Date End Date Taking? Authorizing Provider  ferrous sulfate 325 (65 FE) MG tablet Take 1 tablet (325 mg total) by mouth 2 (two) times daily with a meal. 07/08/18   Schuman, Christanna R, MD  hydrOXYzine (ATARAX/VISTARIL) 50 MG tablet Take 1 tablet (50 mg total) by mouth 3 (three) times daily as needed for anxiety. 09/29/20   Clapacs, Jackquline Denmark, MD  ibuprofen (ADVIL,MOTRIN) 600 MG tablet Take 1 tablet (600 mg total) by mouth every 6 (six) hours. 07/08/18   Schuman, Jaquelyn Bitter, MD  medroxyPROGESTERone (DEPO-PROVERA) 150 MG/ML injection Inject 1 mL (150 mg total) into the muscle every 3 (three) months. 07/08/18   Schuman, Jaquelyn Bitter, MD  NIFEdipine (ADALAT CC) 30 MG 24 hr tablet Take 1 tablet (30 mg total) by mouth daily. 09/29/20   Clapacs, Jackquline Denmark, MD  Prenatal Vit-Fe Fumarate-FA (PRENATAL MULTIVITAMIN) TABS tablet Take 1 tablet by mouth daily at 12 noon.    [provider]  risperiDONE (RISPERDAL) 2 MG tablet Take 1 tablet (2 mg total) by mouth at bedtime. 09/29/20   Clapacs, Jackquline Denmark, MD  sertraline (ZOLOFT) 50 MG tablet Take 1 tablet (50 mg total) by mouth daily. 09/30/20   Clapacs, Jackquline Denmark, MD  traZODone (DESYREL) 100 MG tablet Take 1 tablet (100 mg total) by mouth at bedtime as needed for sleep. 09/29/20   Clapacs, Jackquline Denmark, MD  Allergies Penicillins  History reviewed. No pertinent family history.  Social History Social History   Tobacco Use  . Smoking status: Current Some Day Smoker    Types: Cigarettes    Last attempt to quit: 12/17/2017    Years since quitting: 2.9  . Smokeless tobacco: Never Used  Vaping Use  . Vaping Use: Every day  Substance Use Topics  . Alcohol use: Yes    Comment: "not frequently"  . Drug use: No    Review of Systems  Review of  Systems  Constitutional: Negative for chills and fever.  HENT: Negative for sore throat.   Eyes: Negative for pain.  Respiratory: Negative for cough and stridor.   Cardiovascular: Negative for chest pain.  Gastrointestinal: Negative for vomiting.  Genitourinary: Negative for dysuria.  Musculoskeletal: Negative for myalgias.  Skin: Negative for rash.  Neurological: Negative for seizures, loss of consciousness and headaches.  Psychiatric/Behavioral: Negative for suicidal ideas.  All other systems reviewed and are negative.     ____________________________________________   PHYSICAL EXAM:  VITAL SIGNS: ED Triage Vitals [11/26/20 2121]  Enc Vitals Group     BP 120/86     Pulse Rate 93     Resp 18     Temp 98.3 F (36.8 C)     Temp Source Oral     SpO2 100 %     Weight      Height      Head Circumference      Peak Flow      Pain Score      Pain Loc      Pain Edu?      Excl. in [place]?    Vitals:   11/26/20 2121  BP: 120/86  Pulse: 93  Resp: 18  Temp: 98.3 F (36.8 C)  SpO2: 100%   Physical Exam Vitals and nursing note reviewed.  Constitutional:      General: She is not in acute distress.    Appearance: She is well-developed and well-nourished.  HENT:     Head: Normocephalic and atraumatic.     Right Ear: External ear normal.     Left Ear: External ear normal.     Nose: Nose normal.     Mouth/Throat:     Mouth: Mucous membranes are moist.  Eyes:     Conjunctiva/sclera: Conjunctivae normal.  Cardiovascular:     Rate and Rhythm: Normal rate and regular rhythm.     Heart sounds: No murmur heard.   Pulmonary:     Effort: Pulmonary effort is normal. No respiratory distress.  Abdominal:     General: There is no distension.  Musculoskeletal:        General: No edema.     Cervical back: Neck supple.  Skin:    General: Skin is warm and dry.  Neurological:     Mental Status: She is alert and oriented to person, place, and time.  Psychiatric:        Mood and  Affect: Mood and affect and mood normal. Affect is flat.        Speech: She is noncommunicative.        Thought Content: Thought content does not include homicidal or suicidal ideation.      ____________________________________________   LABS (all labs ordered are listed, but only abnormal results are displayed)  Labs Reviewed  CBC - Abnormal; Notable for the following components:      Result Value   RDW 15.7 (*)    All other  components within normal limits  RESP PANEL BY RT-PCR (FLU A&B, COVID) ARPGX2  COMPREHENSIVE METABOLIC PANEL  ETHANOL  SALICYLATE LEVEL  ACETAMINOPHEN LEVEL  URINE DRUG SCREEN, QUALITATIVE ([facility] ONLY)  POC URINE PREG, ED  POC URINE PREG, ED   ____________________________________________  ____________________________________________   PROCEDURES  Procedure(s) performed (including Critical Care):  Procedures   ____________________________________________   INITIAL IMPRESSION / ASSESSMENT AND PLAN / ED COURSE      Patient presents with above-stated history and exam for assessment after her mother filled IVC paperwork with concerns that patient was acting bizarrely not taking her medicines for schizophrenia.  Patient denies any SI or HI and does not appear to be acutely hallucinating on my exam although is extremely reticent and unwilling to provide any additional history regarding events that led her to come to emergency room today.  She does states she is not currently taking any medications.  Psychiatry and TTS consulted.  No historical or exam findings to suggest an acute traumatic injury, acute infectious process and I would low suspicion for toxic ingestion or severe metabolic derangements at this time although we will send routine psych screening labs.  The patient has been placed in psychiatric observation due to the need to provide a safe environment for the patient while obtaining psychiatric consultation and evaluation, as well as ongoing  medical and medication management to treat the patient's condition.  The patient has been placed under full IVC at this time.   ____________________________________________   FINAL CLINICAL IMPRESSION(S) / ED DIAGNOSES  Final diagnoses:  Behavior concern    Medications - No data to display   ED Discharge Orders    None       Note:  This document was prepared using Dragon voice recognition software and may include unintentional dictation errors.   Gilles Chiquito, MD 11/26/20 2206

## 2020-11-26 NOTE — ED Notes (Signed)
Gave Pt a sandwich tray and ginger ale to drink.

## 2020-11-26 NOTE — ED Triage Notes (Signed)
Pt arrived under IVC by [place] [employer], taken out by mother. Per affidavit, pt has not been taking medication for mental health and today through rice all over house and took a cross off a wall and through it outside. Pt speaking to family about demons and mother reported bizarre behavior from pts medicated baseline. Pt hx/o schizophrenia. Pt in triage is calm and cooperative. Pt reports she has an anger problem. Pt reports she does not take her medication or talk to anyone for her mental health. Per pt, she just moved back into mothers home and got her daughter back. Pt laughing randomly and having loose associations with current situation. Pt denies SI and HI.

## 2020-11-26 NOTE — ED Notes (Signed)
2 red shoes 2 white socks 1 gray pant 1 blue shirt 1 burgundy hoodie  All pt belongings placed into bag with pt information labeled on outside.

## 2020-11-27 ENCOUNTER — Other Ambulatory Visit: Payer: Self-pay

## 2020-11-27 ENCOUNTER — Inpatient Hospital Stay
Admission: RE | Admit: 2020-11-27 | Discharge: 2020-12-02 | DRG: 885 | Disposition: A | Discharge status: Home / Self Care | Payer: No Typology Code available for payment source | Source: Intra-hospital | Attending: Psychiatry | Admitting: Psychiatry

## 2020-11-27 DIAGNOSIS — Z880 Allergy status to penicillin: Secondary | ICD-10-CM | POA: Diagnosis not present

## 2020-11-27 DIAGNOSIS — R4689 Other symptoms and signs involving appearance and behavior: Secondary | ICD-10-CM | POA: Diagnosis not present

## 2020-11-27 DIAGNOSIS — R451 Restlessness and agitation: Secondary | ICD-10-CM | POA: Diagnosis present

## 2020-11-27 DIAGNOSIS — F3113 Bipolar disorder, current episode manic without psychotic features, severe: Secondary | ICD-10-CM | POA: Diagnosis not present

## 2020-11-27 DIAGNOSIS — G4700 Insomnia, unspecified: Secondary | ICD-10-CM | POA: Diagnosis present

## 2020-11-27 DIAGNOSIS — F3110 Bipolar disorder, current episode manic without psychotic features, unspecified: Secondary | ICD-10-CM | POA: Diagnosis present

## 2020-11-27 DIAGNOSIS — Z20822 Contact with and (suspected) exposure to covid-19: Secondary | ICD-10-CM | POA: Diagnosis present

## 2020-11-27 DIAGNOSIS — F17210 Nicotine dependence, cigarettes, uncomplicated: Secondary | ICD-10-CM | POA: Diagnosis present

## 2020-11-27 LAB — RESP PANEL BY RT-PCR (FLU A&B, COVID) ARPGX2
Influenza A by PCR: NEGATIVE
Influenza B by PCR: NEGATIVE
SARS Coronavirus 2 by RT PCR: NEGATIVE

## 2020-11-27 MED ORDER — MAGNESIUM HYDROXIDE 400 MG/5ML PO SUSP: 30.0000 mL | Freq: Every day | ORAL | Status: DC | PRN

## 2020-11-27 MED ORDER — ALUM & MAG HYDROXIDE-SIMETH 200-200-20 MG/5ML PO SUSP: 30.0000 mL | ORAL | Status: DC | PRN

## 2020-11-27 MED ORDER — HYDROXYZINE HCL 25 MG PO TABS
25.0000 mg | ORAL_TABLET | Freq: Three times a day (TID) | ORAL | Status: DC | PRN
  Administered 2020-11-30 – 2020-12-01 (×2): 25 mg via ORAL
  Filled 2020-11-27 (×2): qty 1

## 2020-11-27 MED ORDER — ZIPRASIDONE MESYLATE 20 MG IM SOLR: 20.0000 mg | Freq: Four times a day (QID) | INTRAMUSCULAR | Status: DC | PRN

## 2020-11-27 MED ORDER — RISPERIDONE 1 MG PO TABS
1.0000 mg | ORAL_TABLET | Freq: Every day | ORAL | Status: DC
  Administered 2020-11-27 – 2020-11-28 (×2): 1 mg via ORAL
  Filled 2020-11-27 (×2): qty 1

## 2020-11-27 MED ORDER — ACETAMINOPHEN 325 MG PO TABS
650.0000 mg | ORAL_TABLET | Freq: Four times a day (QID) | ORAL | Status: DC | PRN
  Administered 2020-11-28: 650 mg via ORAL
  Filled 2020-11-27: qty 2

## 2020-11-27 MED ORDER — TRAZODONE HCL 100 MG PO TABS
100.0000 mg | ORAL_TABLET | Freq: Every evening | ORAL | Status: DC | PRN
  Administered 2020-11-29 – 2020-12-01 (×2): 100 mg via ORAL
  Filled 2020-11-27 (×3): qty 1

## 2020-11-27 MED ORDER — ZIPRASIDONE HCL 20 MG PO CAPS: 20.0000 mg | ORAL_CAPSULE | Freq: Four times a day (QID) | ORAL | Status: DC | PRN

## 2020-11-27 NOTE — BH Assessment (Signed)
Comprehensive Clinical Assessment (CCA) Note  11/27/2020 [name] [id number]  Chief Complaint: No chief complaint on file.  Visit Diagnosis: F20.9, Schizophrenia   CCA Screening, Triage and Referral (STR) Scarlette Calico "Joni Reining" Houde is a 25 year old patient who was brought to the [facility] by the police department under an IVC order that was completed by her mother. According to triage notes, "pt has not been taking medication for mental health and today threw rice all over the house and took a cross off a wall and through it outside. Pt speaking to family about demons and mother reported bizarre behavior from pt's medicated baseline. Pt hx of schizophrenia. Pt in triage is calm and cooperative. Pt reports she has an anger problem. Pt reports she does not take her medication or talk to anyone for her mental health. Per pt, she just moved back into mother's home an dgot her daughter back. Pt laughing randomly and having loose associations with current situation. Pt denies SI and HI."  During assessment, pt was quiet and slow to respond to questions. She denies SI, HI, AVH, or SA. Pt shares she is "tired and sleepy;" she shares she was able to sleep well last night while in the hospital. Pt states she has a therapist but, when asked if she remembers the name of this person, did not respond. She did not respond to having a psychiatrist when asked and did not respond to any further questions.  Pt's protective factors include no SI or HI and the support of her mother, whom she apparently lives with.  Pt's mother, whom IVCed pt, is [name]; her documented phone number is [phone].  Pt's orientation and memory were UTA. Pt was slow to respond to answers and was quiet, flat and blunted. Pt's insight, judgement, and impulse control is fair - poor at this time.   Recommendations for Services/Supports/Treatments: Pt was assessed by the [facility] who determined pt meets inpatient  criteria.   Patient Reported Information How did you hear about [place]? Family/Friend  Referral name: Oretha Ellis, mother, Bernestine Amass pt  Referral phone number: [phone]   Whom do you see for routine medical problems? Hospital ER  Practice/Facility Name: [facility] ED  Practice/Facility Phone Number: [phone] (Unknown)  Name of Contact: No data recorded Contact Number: No data recorded Contact Fax Number: No data recorded Prescriber Name: Various  Prescriber Address (if known): [address] [place]   What Is the Reason for Your Visit/Call Today? Pt was IVCed by her mother due to speaking about demons, throwing rice all over the home, and taking a cross off of a wall and throwing it outside. Pt's mother reported pt is not taking her medication.  How Long Has This Been Causing You Problems? -- (Unknown)  What Do You Feel Would Help You the Most Today? -- (UTA; pt did not answer this question)   Have You Recently Been in Any Inpatient Treatment (Hospital/Detox/Crisis Center/28-Day Program)? No  Name/Location of Program/Hospital:No data recorded How Long Were You There? No data recorded When Were You Discharged? No data recorded  Have You Ever Received Services From [facility] Before? No  Who Do You See at [facility]? No data recorded  Have You Recently Had Any Thoughts About Hurting Yourself? No  Are You Planning to Commit Suicide/Harm Yourself At This time? No   Have you Recently Had Thoughts About Hurting Someone Karolee Ohs? No  Explanation: No data recorded  Have You Used Any Alcohol or Drugs in the Past 24 Hours? No  How Long Ago Did You Use Drugs or Alcohol? No data recorded What Did You Use and How Much? No data recorded  Do You Currently Have a Therapist/Psychiatrist? No  Name of Therapist/Psychiatrist: No data recorded  Have You Been Recently Discharged From Any Office Practice or Programs? No  Explanation of Discharge From Practice/Program: No data  recorded    CCA Screening Triage Referral Assessment Type of Contact: Face-to-Face  Is this Initial or Reassessment? No data recorded Date Telepsych consult ordered in [facility]:  No data recorded Time Telepsych consult ordered in [facility]:  No data recorded  Patient Reported Information Reviewed? Yes  Patient Left Without Being Seen? No data recorded Reason for Not Completing Assessment: No data recorded  Collateral Involvement: Jim Like, mother: [phone]   Does Patient Have a [place]? No data recorded Name and Contact of Legal Guardian: No data recorded If Minor and Not Living with Parent(s), Who has Custody? N/A  Is CPS involved or ever been involved? -- (UTA)  Is APS involved or ever been involved? -- (UTA)   Patient Determined To Be At Risk for Harm To Self or Others Based on Review of Patient Reported Information or Presenting Complaint? Yes, for Self-Harm (Pt is currently mentally unstable, which is a danger to herself)  Method: No data recorded Availability of Means: No data recorded Intent: No data recorded Notification Required: No data recorded Additional Information for Danger to Others Potential: No data recorded Additional Comments for Danger to Others Potential: No data recorded Are There Guns or Other Weapons in Your Home? No data recorded Types of Guns/Weapons: No data recorded Are These Weapons Safely Secured?                            No data recorded Who Could Verify You Are Able To Have These Secured: No data recorded Do You Have any Outstanding Charges, Pending Court Dates, Parole/Probation? No data recorded Contacted To Inform of Risk of Harm To Self or Others: Family/Significant Other: (Pt's mother is aware of pt's current mental health status)   Location of Assessment: [facility] ED   Does Patient Present under Involuntary Commitment? Yes  IVC Papers Initial File Date: 11/26/2020   [place] of Residence: [place]   Patient  Currently Receiving the Following Services: Not Receiving Services   Determination of Need: Emergent (2 hours)   Options For Referral: Inpatient Hospitalization     CCA Biopsychosocial Intake/Chief Complaint:  Pt was IVCed by her mother due to speaking about demons, throwing rice all over the home, and taking a cross off of a wall and throwing it outside. Pt's mother reported pt is not taking her medication.  Current Symptoms/Problems: Pt is apparently experiencing delusions   Patient Reported Schizophrenia/Schizoaffective Diagnosis in Past: Yes   Strengths: Pt just got her daughter back into her care. She has strong family support.  Preferences: UTA  Abilities: UTA   Type of Services Patient Feels are Needed: UTA   Initial Clinical Notes/Concerns: N/A   Mental Health Symptoms Depression:  Fatigue   Duration of Depressive symptoms: -- (UTA)   Mania:  -- (UTA)   Anxiety:   None   Psychosis:  Delusions   Duration of Psychotic symptoms: -- (UTA)   Trauma:  None   Obsessions:  None   Compulsions:  None   Inattention:  None   Hyperactivity/Impulsivity:  N/A   Oppositional/Defiant Behaviors:  None   Emotional Irregularity:  Mood lability   Other Mood/Personality Symptoms:  None noted    Mental Status Exam Appearance and self-care  Stature:  Average   Weight:  Average weight   Clothing:  -- (Pt is dressed in scrubs)   Grooming:  Normal   Cosmetic use:  -- (UTA)   Posture/gait:  Other (Comment) (Pt is lying down in her bed with the covers pulled over her face.)   Motor activity:  Not Remarkable   Sensorium  Attention:  -- (Pt was slow to respond to questions posed and eventually stopped answering them.)   Concentration:  -- (Pt was slow to respond to questions posed)   Orientation:  -- (UTA)   Recall/memory:  -- (UTA)   Affect and Mood  Affect:  Blunted; Depressed; Flat   Mood:  Depressed   Relating  Eye contact:  None (Pt did not  uncover her face throughout the entirety of the assessment)   Facial expression:  -- (Pt did not uncover her face throughout the entirety of the assessment)   Attitude toward examiner:  Guarded   Thought and Language  Speech flow: Slow; Soft   Thought content:  Appropriate to Mood and Circumstances (Pt denied AVH and would not answer questions about why she is in the hospital)   Preoccupation:  -- (Pt declined to answer this question)   Hallucinations:  -- (Pt denies)   Organization:  No data recorded  [employer] of Knowledge:  -- (UTA)   Intelligence:  -- (UTA)   Abstraction:  -- (UTA)   Judgement:  Impaired   Reality Testing:  -- (UTA)   Insight:  Gaps   Decision Making:  Impulsive   Social Functioning  Social Maturity:  -- Industrial/product designer)   Social Judgement:  -- (UTA)   Stress  Stressors:  Other (Comment) (UTA)   Coping Ability:  Overwhelmed   Skill Deficits:  Self-control   Supports:  Family; Support needed     Religion: Religion/Spirituality Are You A Religious Person?:  (UTA) How Might This Affect Treatment?: UTA  Leisure/Recreation: Leisure / Recreation Do You Have Hobbies?:  (UTA)  Exercise/Diet: Exercise/Diet Do You Exercise?:  (UTA) Have You Gained or Lost A Significant Amount of Weight in the Past Six Months?:  (UTA) Do You Follow a Special Diet?:  (UTA) Do You Have Any Trouble Sleeping?:  (UTA)   CCA Employment/Education Employment/Work Situation: Employment / Work Situation Employment situation:  Industrial/product designer) Patient's job has been impacted by current illness:  (UTA) What is the longest time patient has a held a job?: UTA Where was the patient employed at that time?: UTA Has patient ever been in the [employer]?: No  Education: Education Is Patient Currently Attending School?:  (UTA) Last Grade Completed:  (UTA) Name of High School: UTA Did Garment/textile technologist From [employer]?:  (UTA) Did You Attend College?:  (UTA) Did You Attend  Graduate School?:  (UTA) Did You Have Any Special Interests In School?: UTA Did You Have An Individualized Education Program (IIEP):  (UTA) Did You Have Any Difficulty At School?:  (UTA) Patient's Education Has Been Impacted by Current Illness:  (UTA)   CCA Family/Childhood History Family and Relationship History: Family history Marital status:  (UTA) Are you sexually active?:  (N/A) What is your sexual orientation?: N/A Has your sexual activity been affected by drugs, alcohol, medication, or emotional stress?: N/A Does patient have children?: Yes How many children?: 1 How is patient's relationship with their children?: UTA  Childhood History:  Childhood History By whom was/is the patient raised?: Grandparents Additional childhood history information: UTA Description of patient's relationship with caregiver when they were a child: UTA Patient's description of current relationship with people who raised him/her: UTA How were you disciplined when you got in trouble as a child/adolescent?: UTA Does patient have siblings?:  (UTA) Did patient suffer any verbal/emotional/physical/sexual abuse as a child?:  (UTA) Did patient suffer from severe childhood neglect?:  (UTA) Has patient ever been sexually abused/assaulted/raped as an adolescent or adult?:  (UTA) Was the patient ever a victim of a crime or a disaster?:  (UTA) Witnessed domestic violence?:  (UTA) Has patient been affected by domestic violence as an adult?:  Industrial/product designer)  Child/Adolescent Assessment:     CCA Substance Use Alcohol/Drug Use: Alcohol / Drug Use Pain Medications: Please see MAR Prescriptions: Please see MAR Over the Counter: Please see MAR History of alcohol / drug use?: No history of alcohol / drug abuse Longest period of sobriety (when/how long): Pt denies SA                         ASAM's:  Six Dimensions of Multidimensional Assessment  Dimension 1:  Acute Intoxication and/or Withdrawal  Potential:      Dimension 2:  Biomedical Conditions and Complications:      Dimension 3:  Emotional, Behavioral, or Cognitive Conditions and Complications:     Dimension 4:  Readiness to Change:     Dimension 5:  Relapse, Continued use, or Continued Problem Potential:     Dimension 6:  Recovery/Living Environment:     ASAM Severity Score:    ASAM Recommended Level of Treatment:     Substance use Disorder (SUD)    Recommendations for Services/Supports/Treatments: Pt was assessed by the [facility] who determined pt meets inpatient criteria.   DSM5 Diagnoses: Patient Active Problem List   Diagnosis Date Noted  . MDD (major depressive disorder), recurrent, severe, with psychosis (HCC) 09/29/2020  . Cocaine use disorder (HCC) 09/26/2020  . Malpresentation of fetus, antepartum 07/05/2018  . Chlamydia infection affecting pregnancy in third trimester, antepartum 07/05/2018  . Trichomonas vaginitis 07/05/2018  . Labor and delivery, indication for care 07/03/2018  . Indication for care in labor and delivery, antepartum 04/16/2018    Patient Centered Plan: Patient is on the following Treatment Plan(s):  Impulse Control   Referrals to Alternative Service(s): Referred to Alternative Service(s):   Place:   Date:   Time:    Referred to Alternative Service(s):   Place:   Date:   Time:    Referred to Alternative Service(s):   Place:   Date:   Time:    Referred to Alternative Service(s):   Place:   Date:   Time:     Ralph Dowdy, LMFT

## 2020-11-27 NOTE — BH Assessment (Signed)
Clinician referred pt to [facility] for potential placement; pt's referral information was provided to [facility], RN, to give to charge nurse to review. F/u at 1325 has charge nurse reviewing pt at this time.

## 2020-11-27 NOTE — ED Notes (Signed)
Pt given breakfast. States "it's not enough good. It's not good." Pt blunt in her conversation and short in her answers. Does not want to shower. Denies SI/HI/AVH at this time.

## 2020-11-27 NOTE — BH Assessment (Signed)
Pt has been accepted to [facility] and can arrive at 1530.  Room: 319 Accepting: SOC Attending: Dr. Neale Burly Call to Report: [phone]

## 2020-11-27 NOTE — ED Provider Notes (Signed)
Emergency Medicine Observation Re-evaluation Note  Robin Wood is a 25 y.o. female, seen on rounds today.  Pt initially presented to the ED for complaints of No chief complaint on file. Currently, the patient is resting.  Physical Exam  BP 120/86 (BP Location: Left Arm)   Pulse 93   Temp 98.3 F (36.8 C) (Oral)   Resp 18   SpO2 100%  Physical Exam Constitutional:      Appearance: She is not ill-appearing or toxic-appearing.  HENT:     Head: Atraumatic.  Eyes:     Extraocular Movements: Extraocular movements intact.     Pupils: Pupils are equal, round, and reactive to light.  Pulmonary:     Effort: Pulmonary effort is normal.  Abdominal:     General: There is no distension.  Musculoskeletal:        General: No deformity.  Skin:    General: Skin is warm and dry.  Neurological:     General: No focal deficit present.     Cranial Nerves: No cranial nerve deficit.      ED Course / MDM  EKG:    I have reviewed the labs performed to date as well as medications administered while in observation.  Recent changes in the last 24 hours include telepsych evaluation with recommendations for continued IVC and inpatient psychiatric admission.  Plan  Current plan is for bed placement for inpatient psychiatric care. Patient is under full IVC at this time.   Delton Prairie, MD 11/27/20 [phone]

## 2020-11-27 NOTE — Plan of Care (Signed)
New admission.  Problem: Education: Goal: Knowledge of [place] [facility] Education information/materials will improve Outcome: Not Progressing Goal: Emotional status will improve Outcome: Not Progressing Goal: Mental status will improve Outcome: Not Progressing Goal: Verbalization of understanding the information provided will improve Outcome: Not Progressing   Problem: Health Behavior/Discharge Planning: Goal: Compliance with treatment plan for underlying cause of condition will improve Outcome: Not Progressing   Problem: Safety: Goal: Periods of time without injury will increase Outcome: Not Progressing   Problem: Education: Goal: Will be free of psychotic symptoms Outcome: Not Progressing   Problem: Health Behavior/Discharge Planning: Goal: Compliance with prescribed medication regimen will improve Outcome: Not Progressing   Problem: Self-Care: Goal: Ability to participate in self-care as condition permits will improve Outcome: Not Progressing

## 2020-11-27 NOTE — ED Notes (Signed)
Writer went to assess patient, however, patient refused to participate in assessment. Patient will need to be reassessed on day shift.

## 2020-11-27 NOTE — ED Notes (Signed)
REport to Dr Shary Decamp, tele psych [facility], completed att and pt woken to speak with [facility] on the tele cart

## 2020-11-27 NOTE — ED Notes (Signed)
Pt appears to be sleeping will hold sleep meds

## 2020-11-27 NOTE — ED Notes (Signed)
Lunch tray given. 

## 2020-11-27 NOTE — ED Notes (Signed)
Pt vitals obtained. Breakfast tray given. Pt calm and resting at this time.  

## 2020-11-27 NOTE — ED Notes (Signed)
Report to demetria, rn

## 2020-11-27 NOTE — ED Notes (Signed)
TTS at bedside. 

## 2020-11-27 NOTE — ED Notes (Signed)
Pt reports reason for visit as "misunderstanding with mother", pt with soft voice, difficult to follow topics as subject shifts rapidly, pt at time laughs loudly and and will cover mouth, smiling and quickly alternating emotions without provocation, pt denies hx of mental illness, reports ETOH use as "sipping wine", reports illegal drug use as "a smoke a little from time to time", pt looks around room when asked but denies hallucinations, denies SI and HI   Pt reports not taking any meds and "I don't need any", pt unable to sit still, prefers to "perch" in bed, unable to to establish meaningful rapport with pt  Pt appears taking miniscule bites of food in meal tray,

## 2020-11-27 NOTE — Progress Notes (Signed)
Admission Note:   Report was received from [place], [place] on a 25 year old  female who presents IVC in no acute distress for the treatment of Schizophrenia and bizarre behavior. Patientt appears with blunted affect and very irritable. Patient would answer some questions for this writer, then she would roll her eyes and refuse to answer others by stating "I don't like repeating myself" and "this time it's irrelevant from the other times". Patient was also flipping her braids around when she became irritated or didn't want to answer anymore questions. Patient denies SI/HI/AVH and pain. Patient also denies any signs/symptoms of depression and anxiety stating that she is "fine, I need to get home to my daughter. I need to go back to work, just the regular". Patient stated that being here is her stressor, and when asked what staff could do in order to better care for her, she stated "I like my space". Patient has a past medical Hx of Anxiety. Skin was assessed with Joy, RN and found to be clear of any abnormal marks apart from multiple tattoos and a belly button ring. Patient searched and no contraband found and unit policies explained and understanding verbalized. Consents obtained. Food and fluids offered, and both accepted. Patient had no additional questions or concerns at this time.

## 2020-11-27 NOTE — Tx Team (Signed)
Initial Treatment Plan 11/27/2020 5:40 PM [name] BHA:[id number]    PATIENT STRESSORS: Marital or family conflict Medication change or noncompliance Substance abuse   PATIENT STRENGTHS: Supportive family/friends Work skills   PATIENT IDENTIFIED PROBLEMS: Medication noncompliance  Bizarre behavior                   DISCHARGE CRITERIA:  Ability to meet basic life and health needs Improved stabilization in mood, thinking, and/or behavior Need for constant or close observation no longer present Reduction of life-threatening or endangering symptoms to within safe limits  PRELIMINARY DISCHARGE PLAN: Outpatient therapy Return to previous living arrangement Return to previous work or school arrangements  PATIENT/FAMILY INVOLVEMENT: This treatment plan has been presented to and reviewed with the patient, Robin Wood. The patient has been given the opportunity to ask questions and make suggestions.  Daria Mcmeekin, RN 11/27/2020, 5:40 PM

## 2020-11-27 NOTE — ED Notes (Signed)
Informed pt that she will be going downstairs to the BMU. Pt waved hand at this RN and said "just don't worry about it".

## 2020-11-28 DIAGNOSIS — F3113 Bipolar disorder, current episode manic without psychotic features, severe: Secondary | ICD-10-CM

## 2020-11-28 NOTE — [facility] Suicide Risk Assessment (Signed)
[facility] Admission Suicide Risk Assessment   Nursing information obtained from:  Patient Demographic factors:  Adolescent or young adult Current Mental Status:  NA Loss Factors:  NA Historical Factors:  NA Risk Reduction Factors:  Living with another person, especially a relative,Responsible for children under 24 years of age  Total Time spent with patient: 1 hour Principal Problem: Bipolar affective disorder, current episode manic (HCC) Diagnosis:  Principal Problem:   Bipolar affective disorder, current episode manic (HCC)  Subjective Data: Patient seen chart reviewed.  Admitted on IVC filed after patient became agitated and violent at family home.  Patient denies having been violent.  Denies psychotic symptoms.  Somewhat evasive in her answers.  Totally denies suicidal ideation.  Continued Clinical Symptoms:  Alcohol Use Disorder Identification Test Final Score (AUDIT): 0 The "Alcohol Use Disorders Identification Test", Guidelines for Use in Primary Care, Second Edition.  World Science writer [facility]). Score between 0-7:  no or low risk or alcohol related problems. Score between 8-15:  moderate risk of alcohol related problems. Score between 16-19:  high risk of alcohol related problems. Score 20 or above:  warrants further diagnostic evaluation for alcohol dependence and treatment.   CLINICAL FACTORS:   Bipolar Disorder:   Mixed State   Musculoskeletal: Strength & Muscle Tone: within normal limits Gait & Station: normal Patient leans: N/A  Psychiatric Specialty Exam: Physical Exam Vitals and nursing note reviewed.  Constitutional:      Appearance: She is well-developed and well-nourished.  HENT:     Head: Normocephalic and atraumatic.  Eyes:     Conjunctiva/sclera: Conjunctivae normal.     Pupils: Pupils are equal, round, and reactive to light.  Cardiovascular:     Heart sounds: Normal heart sounds.  Pulmonary:     Effort: Pulmonary effort is normal.  Abdominal:      Palpations: Abdomen is soft.  Musculoskeletal:        General: Normal range of motion.     Cervical back: Normal range of motion.  Skin:    General: Skin is warm and dry.  Neurological:     General: No focal deficit present.     Mental Status: She is alert.  Psychiatric:        Mood and Affect: Mood normal.     Review of Systems  Constitutional: Negative.   HENT: Negative.   Eyes: Negative.   Respiratory: Negative.   Cardiovascular: Negative.   Gastrointestinal: Negative.   Musculoskeletal: Negative.   Skin: Negative.   Neurological: Negative.   Psychiatric/Behavioral: Negative.     Blood pressure 93/71, pulse 81, temperature 97.8 F (36.6 C), temperature source Oral, resp. rate 14, height 5\' 2"  (1.575 m), weight 38.6 kg, SpO2 100 %, unknown if currently breastfeeding.Body mass index is 15.55 kg/m.  General Appearance: Casual  Eye Contact:  Fair  Speech:  Slow  Volume:  Decreased  Mood:  Euthymic  Affect:  Congruent  Thought Process:  Goal Directed  Orientation:  Full (Time, Place, and Person)  Thought Content:  Logical  Suicidal Thoughts:  No  Homicidal Thoughts:  No  Memory:  Immediate;   Fair Recent;   Fair Remote;   Fair  Judgement:  Impaired  Insight:  Shallow  Psychomotor Activity:  Decreased  Concentration:  Concentration: Poor  Recall:  of Knowledge:  Fair  Language:  Good  Akathisia:  No  Handed:  Right  AIMS (if indicated):     Assets:  Desire for Improvement Physical Health Resilience  ADL's:  Impaired  Cognition:  WNL  Sleep:         COGNITIVE FEATURES THAT CONTRIBUTE TO RISK:  Loss of executive function    SUICIDE RISK:   Minimal: No identifiable suicidal ideation.  Patients presenting with no risk factors but with morbid ruminations; may be classified as minimal risk based on the severity of the depressive symptoms  PLAN OF CARE: Antipsychotic medication.  Engagement in individual and group therapy.  Increasing work on  trying to improve insight.  Reassessment of suicidality prior to discharge  I certify that inpatient services furnished can reasonably be expected to improve the patient's condition.   Mordecai Rasmussen, MD 11/28/2020, 6:54 PM

## 2020-11-28 NOTE — [facility] Counselor (Signed)
Adult Comprehensive Assessment  Patient ID: Robin Wood, female   DOB: [birth year], 25 y.o.   MRN: [id number]  Information Source: Information source: Patient  Current Stressors:  Patient states their primary concerns and needs for treatment are:: per patient, "no sleep" . . . subsequently patient reported becoming more irritable with family. Patient states their goals for this hospitilization and ongoing recovery are:: Per Patient, "Personal space . . . 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, petroleum / Lack of resources (include bankruptcy): "everyone has financial stress" Housing / Lack of housing: none reported Physical health (include injuries & life threatening diseases): none reported Social relationships: none reported Substance abuse: none reported Bereavement / Loss: none reported  Living/Environment/Situation:  Living Arrangements: Parent Living conditions (as described by patient or guardian): "very secure" Who else lives in the home?: Mother How long has patient lived in current situation?: "one and a half days" What is atmosphere in current home: Other (Comment) ("secure")  Family History:  Marital status: Other (comment) (Patient refused to answer.) Are you sexually active?:  (patient refused to answer) What is your sexual orientation?: Patient refused to answer. Has your sexual activity been affected by drugs, alcohol, medication, or emotional stress?: patient refused to answer Does patient have children?: Yes (patient refused to answer, per 11/27/2020 report) How many children?: 1 (Patient refused to answer, per 11/27/2020 report) How is patient's relationship with their children?: Patient refused to answer. "she's the best person in the world" per 09/27/2020 report  Childhood History:  By whom was/is the patient raised?: Grandparents Additional childhood  history information: Patient refused to answer other than "no trauma during childhood" Description of patient's relationship with caregiver when they were a child: Patient refused to answer other than "no trauma during childhood"; per 09/27/2020 report, "she watched out for me" Patient's description of current relationship with people who raised him/her: Patient refused to answer, per 09/27/2020 report, patients grandmother is deceased. How were you disciplined when you got in trouble as a child/adolescent?: Patient refused to answer; per 09/27/2020 reports, the occasional whooping" Does patient have siblings?: Yes (patient refused to answer; per 09/27/2020 report, patient has 2 siblings) Number of Siblings: 2 Description of patient's current relationship with siblings: Patient refused to answer, per 09/27/2020 report, "we cook, we clean, how anyone deals with their siblings" Did patient suffer any verbal/emotional/physical/sexual abuse as a child?: No Did patient suffer from severe childhood neglect?: No Has patient ever been sexually abused/assaulted/raped as an adolescent or adult?: No Was the patient ever a victim of a crime or a disaster?: No Witnessed domestic violence?: No Has patient been affected by domestic violence as an adult?: No  Education:  Highest grade of school patient has completed: Per patient report, "12th grade." (report contridicts previous reports). 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 Currently a student?: Yes Name of school: When asked where she attends school, patient expressed that the question was an invasion of privacy. How long has the patient attended?: Patient refused to answer. Learning disability?:  (Patient refused to answer.)  Employment/Work Situation:   Employment situation: Employed Where is patient currently employed?: Patient refused to answer. How long has patient been employed?: patient refused to answer. 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 Has patient ever been in the [employer]?: No  Financial Resources:   Financial resources: Income from employment Does patient have a representative payee or guardian?: No (Patient became irritable when asked about a payee or gaurdian.)  Alcohol/Substance Abuse:   What has been your use of drugs/alcohol within the last 12 months?: Patient reports no alcohol use, occasional tobacco use. If attempted suicide, did drugs/alcohol play a role in this?: No If yes, describe treatment: Patient refused to answer. Has alcohol/substance abuse ever caused legal problems?: No ("clean record")  Social Support System:   Describe Community Support System: "don't need a support system" Type of faith/religion: patient refused to answer. How does patient's faith help to cope with current illness?: n/a  Leisure/Recreation:   Do You Have Hobbies?: Yes Leisure and Hobbies: Patient innitialy refused to answer . . . CSW noticed a book on patient's bed and asked if she enjoyed to read. Patient acknowleged and covered the book with a blanket. Patient reported she does not enjoy recreational therapy activities.  Strengths/Needs:   What is the patient's perception of their strengths?: Patient refused to answer. Patient states they can use these personal strengths during their treatment to contribute to their recovery: n/a Patient states these barriers may affect/interfere with their treatment: Patient refused to answer. Patient states these barriers may affect their return to the community: n/a Other important information patient would like considered in planning for their treatment: none reported  Discharge Plan:   Currently receiving community mental health services: No Patient states concerns and preferences for  aftercare planning are: Patient declined Patient states they will know when they are safe and ready for discharge when: n/a Does patient have financial barriers related to discharge medications?: Yes (no insurance listed.) Will patient be returning to same living situation after discharge?: Yes (Patient reports returning to mother's residence.)  Summary/Recommendations:   Summary and Recommendations (to be completed by the evaluator): Patient is a 25 year old female, from [place], [place] [facility] [place]).  She reports that she currently has employment, though refused to elaborate.  She presents to the hospital under IVC after acting bizarrely at mother's residence. She has a primary diagnosis of Bipolar affective disorder, current episode manic. Patient refused to answer majority of assessment; patient began assessment open and forthcoming. However, as assessment continued, patient became more guarded. Patient placed blankets from bed over her shoulders, then continued to cover her hair, then eventually her mouth and nose. By the end of the assessment patient turned her body away from the evaluator and refused to answer further questions. Patient refused to provide consent for the evaluator to reach collateral contact or to assist with aftercare coordination. Recommendations include: crisis stabilization, therapeutic milieu, encourage group attendance and participation, medication management for mood stabilization and development of comprehensive mental wellness plan.  Corky Crafts. 11/28/2020

## 2020-11-28 NOTE — [facility] Suicide Risk Assessment (Signed)
[facility] INPATIENT:  Family/Significant Other Suicide Prevention Education  Suicide Prevention Education:  Patient Refusal for Family/Significant Other Suicide Prevention Education: The patient Robin Wood has refused to provide written consent for family/significant other to be provided Family/Significant Other Suicide Prevention Education during admission and/or prior to discharge.  Physician notified. SPE completed with patient.   Signed Corky Crafts, MSW, LCSWA, LCASA  11/28/2020, 9:54 AM

## 2020-11-28 NOTE — Progress Notes (Signed)
Patient is quiet and reserved. She remembers this nurse from previous admit and is pleasant and cooperative with me. She denies SI  HI  AVH  depression anxiety and pain at this encounter. States that she feels like she does not need to be here and needs to get back home to her daughter. Patient also reports having a job and being here at the hospital  is keeping her from being able to go to work. She is med compliant and tolerated her QHS medication without incident. She has been active on the unit and engages well with other patients of similar age. She is safe on the unit with 15 minute safety rounds and encouraged to seek staff with any concerns.      Cleo Butler-Nicholson, LPN

## 2020-11-28 NOTE — [facility] Group Notes (Signed)
LCSW Group Therapy Note   11/28/2020 2:09 PM  Type of Therapy and Topic:  Group Therapy:  Overcoming Obstacles   Participation Level:  Active   Description of Group:    In this group patients will be encouraged to explore what they see as obstacles to their own wellness and recovery. They will be guided to discuss their thoughts, feelings, and behaviors related to these obstacles. The group will process together ways to cope with barriers, with attention given to specific choices patients can make. Each patient will be challenged to identify changes they are motivated to make in order to overcome their obstacles. This group will be process-oriented, with patients participating in exploration of their own experiences as well as giving and receiving support and challenge from other group members.   Therapeutic Goals: 1. Patient will identify personal and current obstacles as they relate to admission. 2. Patient will identify barriers that currently interfere with their wellness or overcoming obstacles.  3. Patient will identify feelings, thought process and behaviors related to these barriers. 4. Patient will identify two changes they are willing to make to overcome these obstacles:      Summary of Patient Progress Patient was active in group.  Patient was supportive of other group members.  Patient reports that her barriers have been "over isolation".  She reports plans to increase her interaction with others to address her barrier.     Therapeutic Modalities:   Cognitive Behavioral Therapy Solution Focused Therapy Motivational Interviewing Relapse Prevention Therapy  Penni Homans, MSW, LCSW 11/28/2020 2:09 PM

## 2020-11-28 NOTE — Plan of Care (Signed)
  Problem: Education: Goal: Knowledge of [place] [facility] Education information/materials will improve Outcome: Progressing Goal: Emotional status will improve Outcome: Progressing Goal: Mental status will improve Outcome: Progressing Goal: Verbalization of understanding the information provided will improve Outcome: Progressing   Problem: Health Behavior/Discharge Planning: Goal: Compliance with treatment plan for underlying cause of condition will improve Outcome: Progressing   Problem: Safety: Goal: Periods of time without injury will increase Outcome: Progressing   Problem: Education: Goal: Will be free of psychotic symptoms Outcome: Progressing   Problem: Health Behavior/Discharge Planning: Goal: Compliance with prescribed medication regimen will improve Outcome: Progressing   Problem: Self-Care: Goal: Ability to participate in self-care as condition permits will improve Outcome: Progressing

## 2020-11-28 NOTE — H&P (Signed)
Psychiatric Admission Assessment Adult  Patient Identification: Robin Wood MRN:  [id number] Date of Evaluation:  11/28/2020 Chief Complaint:  Bipolar affective disorder, current episode manic (HCC) [F31.10] Principal Diagnosis: Bipolar affective disorder, current episode manic (HCC) Diagnosis:  Principal Problem:   Bipolar affective disorder, current episode manic (HCC)  History of Present Illness: Patient seen chart reviewed.  25 year old woman with a history of bipolar disorder possible schizoaffective disorder brought in under IVC filed by the family after she reportedly became agitated at home caring property off the wall making a mess.  Patient is neatly dressed and groomed.  Mostly stays isolated on the unit.  In interview she minimizes symptoms.  Extremely vague in her discussion of her own personal plans.  Denies suicidal or homicidal ideation.  Denies hallucinations.  Insight very limited but not 0. Associated Signs/Symptoms: Depression Symptoms:  insomnia, Duration of Depression Symptoms: -- (UTA)  (Hypo) Manic Symptoms:  Impulsivity, Irritable Mood, Labiality of Mood, Anxiety Symptoms:  None reported Psychotic Symptoms:  Paranoia, Duration of Psychotic Symptoms: -- (UTA)  PTSD Symptoms: Negative Total Time spent with patient: 1 hour  Past Psychiatric History: Past history of psychotic presentations with both depressive and manic features.  Poor insight limited follow-up with outpatient treatment.  Occasional abuse of substances contributing  Is the patient at risk to self? Yes.    Has the patient been a risk to self in the past 6 months? Yes.    Has the patient been a risk to self within the distant past? Yes.    Is the patient a risk to others? No.  Has the patient been a risk to others in the past 6 months? No.  Has the patient been a risk to others within the distant past? No.   Prior Inpatient Therapy:   Prior Outpatient Therapy:    Alcohol Screening: 1.  How often do you have a drink containing alcohol?: Never 2. How many drinks containing alcohol do you have on a typical day when you are drinking?: 1 or 2 3. How often do you have six or more drinks on one occasion?: Never AUDIT-C Score: 0 4. How often during the last year have you found that you were not able to stop drinking once you had started?: Never 5. How often during the last year have you failed to do what was normally expected from you because of drinking?: Never 6. How often during the last year have you needed a first drink in the morning to get yourself going after a heavy drinking session?: Never 7. How often during the last year have you had a feeling of guilt of remorse after drinking?: Never 8. How often during the last year have you been unable to remember what happened the night before because you had been drinking?: Never 9. Have you or someone else been injured as a result of your drinking?: No 10. Has a relative or friend or a doctor or another health worker been concerned about your drinking or suggested you cut down?: No Alcohol Use Disorder Identification Test Final Score (AUDIT): 0 Alcohol Brief Interventions/Follow-up: AUDIT Score <7 follow-up not indicated Substance Abuse History in the last 12 months:  Yes.   Consequences of Substance Abuse: Negative Previous Psychotropic Medications: No  Psychological Evaluations: No  Past Medical History:  Past Medical History:  Diagnosis Date  . Anxiety   . Insomnia     Past Surgical History:  Procedure Laterality Date  . CESAREAN SECTION N/A 07/05/2018   Procedure:  CESAREAN SECTION;  Surgeon: Conard Novak, MD;  Location: [facility] ORS;  Service: Obstetrics;  Laterality: N/A;  . NO PAST SURGERIES     Family History: History reviewed. No pertinent family history. Family Psychiatric  History: None reported Tobacco Screening: Have you used any form of tobacco in the last 30 days? (Cigarettes, Smokeless Tobacco, Cigars,  and/or Pipes): Yes Tobacco use, Select all that apply: 5 or more cigarettes per day Are you interested in Tobacco Cessation Medications?: No, patient refused Counseled patient on smoking cessation including recognizing danger situations, developing coping skills and basic information about quitting provided: Refused/Declined practical counseling Social History:  Social History   Substance and Sexual Activity  Alcohol Use Yes   Comment: "not frequently"     Social History   Substance and Sexual Activity  Drug Use No    Additional Social History: Marital status: Other (comment) (Patient refused to answer.) Are you sexually active?:  (patient refused to answer) What is your sexual orientation?: Patient refused to answer. Has your sexual activity been affected by drugs, alcohol, medication, or emotional stress?: patient refused to answer Does patient have children?: Yes (patient refused to answer, per 11/27/2020 report) How many children?: 1 (Patient refused to answer, per 11/27/2020 report) How is patient's relationship with their children?: Patient refused to answer. "she's the best person in the world" per 09/27/2020 report                         Allergies:   Allergies  Allergen Reactions  . Penicillins Hives   Lab Results:  Results for orders placed or performed during the hospital encounter of 11/26/20 (from the past 48 hour(s))  Comprehensive metabolic panel     Status: Abnormal   Collection Time: 11/26/20  9:33 PM  Result Value Ref Range   Sodium 137 135 - 145 mmol/L   Potassium 3.3 (L) 3.5 - 5.1 mmol/L   Chloride 100 98 - 111 mmol/L   CO2 26 22 - 32 mmol/L   Glucose, Bld 96 70 - 99 mg/dL    Comment: Glucose reference range applies only to samples taken after fasting for at least 8 hours.   BUN 14 6 - 20 mg/dL   Creatinine, Ser 4.31 0.44 - 1.00 mg/dL   Calcium 9.1 8.9 - 54.0 mg/dL   Total Protein 7.7 6.5 - 8.1 g/dL   Albumin 4.2 3.5 - 5.0 g/dL   AST 30 15 -  41 U/L   ALT 24 0 - 44 U/L   Alkaline Phosphatase 59 38 - 126 U/L   Total Bilirubin 0.4 0.3 - 1.2 mg/dL   GFR, Estimated >08 >67 mL/min    Comment: (NOTE) Calculated using the CKD-EPI Creatinine Equation (2021)    Anion gap 11 5 - 15    Comment: Performed at [facility], [address] [place], [place] [address]  Ethanol     Status: None   Collection Time: 11/26/20  9:33 PM  Result Value Ref Range   Alcohol, Ethyl (B) <10 <10 mg/dL    Comment: (NOTE) Lowest detectable limit for serum alcohol is 10 mg/dL.  For medical purposes only. Performed at [facility], [address] [place], [place] [address]   Salicylate level     Status: Abnormal   Collection Time: 11/26/20  9:33 PM  Result Value Ref Range   Salicylate Lvl <7.0 (L) 7.0 - 30.0 mg/dL    Comment: Performed at [facility], [address] [place], [place] [address]  Acetaminophen level     Status: Abnormal   Collection Time: 11/26/20  9:33 PM  Result Value Ref Range   Acetaminophen (Tylenol), Serum <10 (L) 10 - 30 ug/mL    Comment: (NOTE) Therapeutic concentrations vary significantly. A range of 10-30 ug/mL  may be an effective concentration for many patients. However, some  are best treated at concentrations outside of this range. Acetaminophen concentrations >150 ug/mL at 4 hours after ingestion  and >50 ug/mL at 12 hours after ingestion are often associated with  toxic reactions.  Performed at [facility], [address] [place], [place] [address]   cbc     Status: Abnormal   Collection Time: 11/26/20  9:33 PM  Result Value Ref Range   WBC 6.5 4.0 - 10.5 K/uL   RBC 5.04 3.87 - 5.11 MIL/uL   Hemoglobin 13.2 12.0 - 15.0 g/dL   HCT 42.0 36.0 - 46.0 %   MCV 83.3 80.0 - 100.0 fL   MCH 26.2 26.0 - 34.0 pg   MCHC 31.4 30.0 - 36.0 g/dL   RDW 15.7 (H) 11.5 - 15.5 %   Platelets 192 150 - 400 K/uL   nRBC 0.0 0.0 - 0.2 %    Comment: Performed at [facility],  [address], [place], [place] [address]  Urine Drug Screen, Qualitative     Status: Abnormal   Collection Time: 11/26/20  9:33 PM  Result Value Ref Range   Tricyclic, Ur Screen NONE DETECTED NONE DETECTED   Amphetamines, Ur Screen NONE DETECTED NONE DETECTED   MDMA (Ecstasy)Ur Screen NONE DETECTED NONE DETECTED   Cocaine Metabolite,Ur [place] POSITIVE (A) NONE DETECTED   Opiate, Ur Screen NONE DETECTED NONE DETECTED   Phencyclidine (PCP) Ur S NONE DETECTED NONE DETECTED   Cannabinoid 50 Ng, Ur [place] POSITIVE (A) NONE DETECTED   Barbiturates, Ur Screen NONE DETECTED NONE DETECTED   Benzodiazepine, Ur Scrn NONE DETECTED NONE DETECTED   Methadone Scn, Ur NONE DETECTED NONE DETECTED    Comment: (NOTE) Tricyclics + metabolites, urine    Cutoff 1000 ng/mL Amphetamines + metabolites, urine  Cutoff 1000 ng/mL MDMA (Ecstasy), urine              Cutoff 500 ng/mL Cocaine Metabolite, urine          Cutoff 300 ng/mL Opiate + metabolites, urine        Cutoff 300 ng/mL Phencyclidine (PCP), urine         Cutoff 25 ng/mL Cannabinoid, urine                 Cutoff 50 ng/mL Barbiturates + metabolites, urine  Cutoff 200 ng/mL Benzodiazepine, urine              Cutoff 200 ng/mL Methadone, urine                   Cutoff 300 ng/mL  The urine drug screen provides only a preliminary, unconfirmed analytical test result and should not be used for non-medical purposes. Clinical consideration and professional judgment should be applied to any positive drug screen result due to possible interfering substances. A more specific alternate chemical method must be used in order to obtain a confirmed analytical result. Gas chromatography / mass spectrometry (GC/MS) is the preferred confirm atory method. Performed at [facility], [address] [place], [place] [address]   POC urine preg, ED     Status: None   Collection  Time: 11/26/20  9:46 PM  Result Value Ref Range   Preg Test, Ur NEGATIVE NEGATIVE     Comment:        THE SENSITIVITY OF THIS METHODOLOGY IS >24 mIU/mL   POC urine preg, ED (not at [facility])     Status: None   Collection Time: 11/26/20  9:47 PM  Result Value Ref Range   Preg Test, Ur NEGATIVE NEGATIVE    Comment:        THE SENSITIVITY OF THIS METHODOLOGY IS >24 mIU/mL   Resp Panel by RT-PCR (Flu A&B, Covid) Nasopharyngeal Swab     Status: None   Collection Time: 11/27/20  1:19 AM   Specimen: Nasopharyngeal Swab; Nasopharyngeal(NP) swabs in vial transport medium  Result Value Ref Range   SARS Coronavirus 2 by RT PCR NEGATIVE NEGATIVE    Comment: (NOTE) SARS-CoV-2 target nucleic acids are NOT DETECTED.  The SARS-CoV-2 RNA is generally detectable in upper respiratory specimens during the acute phase of infection. The lowest concentration of SARS-CoV-2 viral copies this assay can detect is 138 copies/mL. A negative result does not preclude SARS-Cov-2 infection and should not be used as the sole basis for treatment or other patient management decisions. A negative result may occur with  improper specimen collection/handling, submission of specimen other than nasopharyngeal swab, presence of viral mutation(s) within the areas targeted by this assay, and inadequate number of viral copies(<138 copies/mL). A negative result must be combined with clinical observations, patient history, and epidemiological information. The expected result is Negative.  Fact Sheet for Patients:  BloggerCourse.comhttps://www.fda.gov/media/152166/download  Fact Sheet for Healthcare Providers:  SeriousBroker.ithttps://www.fda.gov/media/152162/download  This test is no t yet approved or cleared by the [place] FDA and  has been authorized for detection and/or diagnosis of SARS-CoV-2 by FDA under an Emergency Use Authorization (EUA). This EUA will remain  in effect (meaning this test can be used) for the duration of the COVID-19 declaration under Section 564(b)(1) of the Act, 21 U.S.C.section 360bbb-3(b)(1), unless the  authorization is terminated  or revoked sooner.       Influenza A by PCR NEGATIVE NEGATIVE   Influenza B by PCR NEGATIVE NEGATIVE    Comment: (NOTE) The Xpert Xpress SARS-CoV-2/FLU/RSV plus assay is intended as an aid in the diagnosis of influenza from Nasopharyngeal swab specimens and should not be used as a sole basis for treatment. Nasal washings and aspirates are unacceptable for Xpert Xpress SARS-CoV-2/FLU/RSV testing.  Fact Sheet for Patients: BloggerCourse.comhttps://www.fda.gov/media/152166/download  Fact Sheet for Healthcare Providers: SeriousBroker.ithttps://www.fda.gov/media/152162/download  This test is not yet approved or cleared by the [place] FDA and has been authorized for detection and/or diagnosis of SARS-CoV-2 by FDA under an Emergency Use Authorization (EUA). This EUA will remain in effect (meaning this test can be used) for the duration of the COVID-19 declaration under Section 564(b)(1) of the Act, 21 U.S.C. section 360bbb-3(b)(1), unless the authorization is terminated or revoked.  Performed at [facility], [address] [place], [place] [id number]     Blood Alcohol level:  Lab Results  Component Value Date   [facility] <[facility] 11/26/2020   ETH <10 09/25/2020    Metabolic Disorder Labs:  No results found for: HGBA1C, MPG No results found for: PROLACTIN Lab Results  Component Value Date   CHOL 152 09/26/2020   TRIG 46 09/26/2020   HDL 50 09/26/2020   CHOLHDL 3.0 09/26/2020   VLDL 9 09/26/2020   LDLCALC 93 09/26/2020    Current Medications: Current Facility-Administered Medications  Medication  Dose Route Frequency Provider Last Rate Last Admin  . acetaminophen (TYLENOL) tablet 650 mg  650 mg Oral Q6H PRN Thalia Party, MD      . alum & mag hydroxide-simeth (MAALOX/MYLANTA) 200-200-20 MG/5ML suspension 30 mL  30 mL Oral Q4H PRN Thalia Party, MD      . hydrOXYzine (ATARAX/VISTARIL) tablet 25 mg  25 mg Oral TID PRN Thalia Party, MD      . magnesium hydroxide (MILK OF  MAGNESIA) suspension 30 mL  30 mL Oral Daily PRN Paliy, Serina Cowper, MD      . risperiDONE (RISPERDAL) tablet 1 mg  1 mg Oral QHS Thalia Party, MD   1 mg at 11/27/20 2108  . traZODone (DESYREL) tablet 100 mg  100 mg Oral QHS PRN Thalia Party, MD      . ziprasidone (GEODON) capsule 20 mg  20 mg Oral Q6H PRN Thalia Party, MD      . ziprasidone (GEODON) injection 20 mg  20 mg Intramuscular Q6H PRN Thalia Party, MD       PTA Medications: Medications Prior to Admission  Medication Sig Dispense Refill Last Dose  . hydrOXYzine (ATARAX/VISTARIL) 50 MG tablet Take 1 tablet (50 mg total) by mouth 3 (three) times daily as needed for anxiety. (Patient not taking: No sig reported) 60 tablet 1   . medroxyPROGESTERone (DEPO-PROVERA) 150 MG/ML injection Inject 1 mL (150 mg total) into the muscle every 3 (three) months. 1 mL 3   . NIFEdipine (ADALAT CC) 30 MG 24 hr tablet Take 1 tablet (30 mg total) by mouth daily. (Patient not taking: No sig reported) 30 tablet 1   . risperiDONE (RISPERDAL) 2 MG tablet Take 1 tablet (2 mg total) by mouth at bedtime. (Patient not taking: No sig reported) 30 tablet 1   . sertraline (ZOLOFT) 50 MG tablet Take 1 tablet (50 mg total) by mouth daily. (Patient not taking: No sig reported) 30 tablet 1   . traZODone (DESYREL) 100 MG tablet Take 1 tablet (100 mg total) by mouth at bedtime as needed for sleep. (Patient not taking: No sig reported) 30 tablet 1     Musculoskeletal: Strength & Muscle Tone: within normal limits Gait & Station: normal Patient leans: N/A  Psychiatric Specialty Exam: Physical Exam Vitals and nursing note reviewed.  Constitutional:      Appearance: She is well-developed and well-nourished.  HENT:     Head: Normocephalic and atraumatic.  Eyes:     Conjunctiva/sclera: Conjunctivae normal.     Pupils: Pupils are equal, round, and reactive to light.  Cardiovascular:     Heart sounds: Normal heart sounds.  Pulmonary:     Effort: Pulmonary effort is normal.   Abdominal:     Palpations: Abdomen is soft.  Musculoskeletal:        General: Normal range of motion.     Cervical back: Normal range of motion.  Skin:    General: Skin is warm and dry.  Neurological:     General: No focal deficit present.     Mental Status: She is alert.  Psychiatric:        Mood and Affect: Mood normal.     Review of Systems  Constitutional: Negative.   HENT: Negative.   Eyes: Negative.   Respiratory: Negative.   Cardiovascular: Negative.   Gastrointestinal: Negative.   Musculoskeletal: Negative.   Skin: Negative.   Neurological: Negative.   Psychiatric/Behavioral: Negative.     Blood pressure 93/71, pulse 81, temperature 97.8 F (36.6 C),  temperature source Oral, resp. rate 14, height 5\' 2"  (1.575 m), weight 38.6 kg, SpO2 100 %, unknown if currently breastfeeding.Body mass index is 15.55 kg/m.  General Appearance: Casual  Eye Contact:  Good  Speech:  Clear and Coherent  Volume:  Normal  Mood:  Euthymic  Affect:  Flat  Thought Process:  Disorganized  Orientation:  Full (Time, Place, and Person)  Thought Content:  Illogical  Suicidal Thoughts:  No  Homicidal Thoughts:  No  Memory:  Immediate;   Fair Recent;   Fair Remote;   Fair  Judgement:  Impaired  Insight:  Shallow  Psychomotor Activity:  Restlessness  Concentration:  Concentration: Fair  Recall:  of Knowledge:  Fair  Language:  Fair  Akathisia:  No  Handed:  Right  AIMS (if indicated):     Assets:  Desire for Improvement  ADL's:  Intact  Cognition:  WNL  Sleep:       Treatment Plan Summary: Plan Continue current medication plan low-dose antipsychotics.  Encourage group attendance.  Reassess dangerousness over the next couple days and work on coming up with a plan for outpatient treatment  Observation Level/Precautions:  15 minute checks  Laboratory:  Chemistry Profile  Psychotherapy:    Medications:    Consultations:    Discharge Concerns:    Estimated LOS:   Other:     Physician Treatment Plan for Primary Diagnosis: Bipolar affective disorder, current episode manic (HCC) Long Term Goal(s): Improvement in symptoms so as ready for discharge  Short Term Goals: Ability to demonstrate self-control will improve  Physician Treatment Plan for Secondary Diagnosis: Principal Problem:   Bipolar affective disorder, current episode manic (HCC)  Long Term Goal(s): Improvement in symptoms so as ready for discharge  Short Term Goals: Compliance with prescribed medications will improve  I certify that inpatient services furnished can reasonably be expected to improve the patient's condition.    [employer], MD 1/3/20226:59 PM

## 2020-11-28 NOTE — Plan of Care (Signed)
D- Patient alert and oriented. Patient presents in a pleasant mood on assessment reporting that she slept good last night and had no complaints to voice to this Clinical research associate. Patient denies SI, HI, AVH, and pain at this time. Patient also denies signs/symptoms of depression and anxiety. She is less irritable today than she was yesterday, however, she is still somewhat guarded with this Clinical research associate. Patient's goal for today is "to sleep more often".  A- Support and encouragement provided.  Routine safety checks conducted every 15 minutes.  Patient informed to notify staff with problems or concerns.  R- Patient contracts for safety at this time. Patient compliant with treatment plan. Patient receptive, calm, and cooperative. Patient interacts well with others on the unit. Patient remains safe at this time.  Problem: Education: Goal: Knowledge of [facility] Education information/materials will improve Outcome: Progressing Goal: Emotional status will improve Outcome: Progressing Goal: Mental status will improve Outcome: Progressing Goal: Verbalization of understanding the information provided will improve Outcome: Progressing   Problem: Health Behavior/Discharge Planning: Goal: Compliance with treatment plan for underlying cause of condition will improve Outcome: Progressing   Problem: Safety: Goal: Periods of time without injury will increase Outcome: Progressing   Problem: Education: Goal: Will be free of psychotic symptoms Outcome: Progressing   Problem: Health Behavior/Discharge Planning: Goal: Compliance with prescribed medication regimen will improve Outcome: Progressing   Problem: Self-Care: Goal: Ability to participate in self-care as condition permits will improve Outcome: Progressing

## 2020-11-28 NOTE — Progress Notes (Signed)
Recreation Therapy Notes   Date: 11/28/2020  Time: 9:30 am   Location: Craft room     Behavioral response: N/A   Intervention Topic: Self-Care   Discussion/Intervention: Patient did not attend group.   Clinical Observations/Feedback:  Patient did not attend group.   Kristilyn Coltrane LRT/CTRS         Rupa Lagan 11/28/2020 11:16 AM

## 2020-11-28 NOTE — Progress Notes (Signed)
Recreation Therapy Notes  INPATIENT RECREATION THERAPY ASSESSMENT  Patient Details Name: Robin Wood MRN: [id number] DOB: 09-16-1996 Today's Date: 11/28/2020       Information Obtained From: Chart Review  Able to Participate in Assessment/Interview: Yes  Patient Presentation: Responsive,Withdrawn  Reason for Admission (Per Patient): Active Symptoms  Patient Stressors:    Coping Skills:    (None)  Leisure Interests (2+):   (None)  Frequency of Recreation/Participation:    Awareness of Community Resources:     [employer]:     Current Use:    If no, Barriers?:    Expressed Interest in [employer] Information:    [place] of Residence:  Film/video editor  Patient Main Form of Transportation:    Patient Strengths:  None  Patient Identified Areas of Improvement:  None  Patient Goal for Hospitalization:  None  Current SI (including self-harm):     Current HI:     Current AVH:    Staff Intervention Plan: Group Attendance,Collaborate with Interdisciplinary Treatment Team  Consent to Intern Participation: N/A  Robin Wood 11/28/2020, 11:57 AM

## 2020-11-28 NOTE — Progress Notes (Signed)
Recreation Therapy Notes  INPATIENT RECREATION TR PLAN  Patient Details Name: Robin Wood MRN: [id number] DOB: 1996/10/09 Today's Date: 11/28/2020  Rec Therapy Plan Is patient appropriate for Therapeutic Recreation?: Yes Treatment times per week: at least 3 Estimated Length of Stay: 5-7 days TR Treatment/Interventions: Group participation (Comment)  Discharge Criteria Pt will be discharged from therapy if:: Discharged Treatment plan/goals/alternatives discussed and agreed upon by:: Patient/family  Discharge Summary     [name] 11/28/2020, 12:02 PM

## 2020-11-29 MED ORDER — RISPERIDONE 1 MG PO TABS
2.0000 mg | ORAL_TABLET | Freq: Every day | ORAL | Status: DC
  Administered 2020-11-29: 2 mg via ORAL
  Filled 2020-11-29: qty 2

## 2020-11-29 NOTE — Progress Notes (Signed)
Patient is quiet and reserved.   She has been active on the unit and engages well with others on the close to her age. She was observed earlier in the day sitting on her bed talking talking out the window, but continues to deny AVH SI and HI. Complaint of mild headache that she rated 4/10 and received medication to help with symptoms. She tolerated her QHS and PRN medications without incident. She remains safe on the unit with 15 minute safety checks and encouraged to come to staff with any concerns.     Cleo Butler-Nicholson, LPN

## 2020-11-29 NOTE — Progress Notes (Signed)
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  Patient mostly stays to her self but is taking care of her hygiene well.  She met with treatment team today and said almost nothing.  Showed no insight.  No sign of any side effects from medicine.  Does appear to be tolerating them.  Has not been aggressive or agitated on the ward but still seems a little odd in her thinking Principal Problem: Bipolar affective disorder, current episode manic ([place]) Diagnosis: Principal Problem:   Bipolar affective disorder, current episode manic ([place])  Total Time spent with patient: 30 minutes  Past Psychiatric History: Past history of bipolar disorder with psychotic episodes  Past Medical History:  Past Medical History:  Diagnosis Date  . Anxiety   . Insomnia     Past Surgical History:  Procedure Laterality Date  . CESAREAN SECTION N/A 07/05/2018   Procedure: CESAREAN SECTION;  Surgeon: Will Bonnet, MD;  Location: [facility] ORS;  Service: Obstetrics;  Laterality: N/A;  . NO PAST SURGERIES     Family History: History reviewed. No pertinent family history. Family Psychiatric  History: See previous Social History:  Social History   Substance and Sexual Activity  Alcohol Use Yes   Comment: "not frequently"     Social History   Substance and Sexual Activity  Drug Use No    Social History   Socioeconomic History  . Marital status: Single    Spouse name: Not on file  . Number of children: 0  . Years of education: Not on file  . Highest education level: 11th grade  Occupational History  . Not on file  Tobacco Use  . Smoking status: Current Some Day Smoker    Types: Cigarettes    Last attempt to quit: 12/17/2017    Years since quitting: 2.9  . Smokeless tobacco: Never Used  Vaping Use  . Vaping Use: Every day  Substance and Sexual Activity  . Alcohol use: Yes    Comment: "not  frequently"  . Drug use: No  . Sexual activity: Yes    Birth control/protection: Injection    Comment: Depo  Other Topics Concern  . Not on file  Social History Narrative  . Not on file   Social Determinants of Health   Financial Resource Strain: Not on file  Food Insecurity: Not on file  Transportation Needs: Not on file  Physical Activity: Not on file  Stress: Not on file  Social Connections: Not on file   Additional Social History:                         Sleep: Fair  Appetite:  Fair  Current Medications: Current Facility-Administered Medications  Medication Dose Route Frequency Provider Last Rate Last Admin  . acetaminophen (TYLENOL) tablet 650 mg  650 mg Oral Q6H PRN Larita Fife, MD   650 mg at 11/28/20 2130  . alum & mag hydroxide-simeth (MAALOX/MYLANTA) 200-200-20 MG/5ML suspension 30 mL  30 mL Oral Q4H PRN Larita Fife, MD      . hydrOXYzine (ATARAX/VISTARIL) tablet 25 mg  25 mg Oral TID PRN Larita Fife, MD      . magnesium hydroxide (MILK OF MAGNESIA) suspension 30 mL  30 mL Oral Daily PRN Paliy, Alisa, MD      . risperiDONE (RISPERDAL) tablet 2 mg  2 mg Oral QHS Iram Lundberg, Madie Reno, MD      .  traZODone (DESYREL) tablet 100 mg  100 mg Oral QHS PRN Larita Fife, MD      . ziprasidone (GEODON) capsule 20 mg  20 mg Oral Q6H PRN Paliy, Alisa, MD      . ziprasidone (GEODON) injection 20 mg  20 mg Intramuscular Q6H PRN Larita Fife, MD        Lab Results: No results found for this or any previous visit (from the past 48 hour(s)).  Blood Alcohol level:  Lab Results  Component Value Date   ETH <10 11/26/2020   ETH <10 98/92/1194    Metabolic Disorder Labs: No results found for: HGBA1C, MPG No results found for: PROLACTIN Lab Results  Component Value Date   CHOL 152 09/26/2020   TRIG 46 09/26/2020   HDL 50 09/26/2020   CHOLHDL 3.0 09/26/2020   VLDL 9 09/26/2020   LDLCALC 93 09/26/2020    Physical Findings: AIMS:  , ,  ,  ,    CIWA:    COWS:      Musculoskeletal: Strength & Muscle Tone: within normal limits Gait & Station: normal Patient leans: N/A  Psychiatric Specialty Exam: Physical Exam Vitals and nursing note reviewed.  Constitutional:      Appearance: She is well-developed and well-nourished.  HENT:     Head: Normocephalic and atraumatic.  Eyes:     Conjunctiva/sclera: Conjunctivae normal.     Pupils: Pupils are equal, round, and reactive to light.  Cardiovascular:     Heart sounds: Normal heart sounds.  Pulmonary:     Effort: Pulmonary effort is normal.  Abdominal:     Palpations: Abdomen is soft.  Musculoskeletal:        General: Normal range of motion.     Cervical back: Normal range of motion.  Skin:    General: Skin is warm and dry.  Neurological:     General: No focal deficit present.     Mental Status: She is alert.  Psychiatric:        Attention and Perception: She is inattentive.        Mood and Affect: Affect is inappropriate.        Speech: She is noncommunicative.     Review of Systems  Constitutional: Negative.   HENT: Negative.   Eyes: Negative.   Respiratory: Negative.   Cardiovascular: Negative.   Gastrointestinal: Negative.   Musculoskeletal: Negative.   Skin: Negative.   Neurological: Negative.   Psychiatric/Behavioral: Negative.     Blood pressure 91/74, pulse 79, temperature 98.3 F (36.8 C), temperature source Oral, resp. rate 18, height '5\' 2"'  (1.575 m), weight 38.6 kg, SpO2 100 %, unknown if currently breastfeeding.Body mass index is 15.55 kg/m.  General Appearance: Casual  Eye Contact:  Good  Speech:  Clear and Coherent  Volume:  Normal  Mood:  Euthymic  Affect:  Constricted  Thought Process:  Coherent  Orientation:  Full (Time, Place, and Person)  Thought Content:  Logical  Suicidal Thoughts:  No  Homicidal Thoughts:  No  Memory:  Immediate;   Fair Recent;   Fair Remote;   Fair  Judgement:  Fair  Insight:  Fair  Psychomotor Activity:  Normal  Concentration:   Concentration: Fair  Recall:  [employer] of Knowledge:  Fair  Language:  Fair  Akathisia:  No  Handed:  Right  AIMS (if indicated):     Assets:  Desire for Improvement  ADL's:  Intact  Cognition:  WNL  Sleep:  Number of Hours: 8  Treatment Plan Summary: Plan Overall stabilizing on current medicine.  Try to work with her on appropriate discharge planning but discharge likely later this week.  Alethia Berthold, MD 11/29/2020, 7:08 PM

## 2020-11-29 NOTE — Progress Notes (Signed)
Recreation Therapy Notes   Date: 11/30/2019  Time: 9:30 am   Location: Craft room   Behavioral response: Appropriate  Intervention Topic: Relaxation   Discussion/Intervention:  Group content today was focused on relaxation. The group defined relaxation and identified healthy ways to relax. Individuals expressed how much time they spend relaxing. Patients expressed how much their life would be if they did not make time for themselves to relax. The group stated ways they could improve their relaxation techniques in the future.  Individuals participated in the intervention "Time to Relax" where they had a chance to experience different relaxation techniques.   Clinical Observations/Feedback: Patient came to group and defined relaxation as doing what suits you. Individual was social with peers and staff while participating in the intervention.  Josephyne Tarter LRT/CTRS           Lener Ventresca 11/29/2020 12:30 PM

## 2020-11-29 NOTE — Plan of Care (Addendum)
Pt denies anxiety, depression, SI, HI and AVH. Pt was educated on care plan and verbalizes understanding. Torrie Mayers RN Problem: Education: Goal: Charity fundraiser Education information/materials will improve Outcome: Progressing Goal: Emotional status will improve Outcome: Progressing Goal: Mental status will improve Outcome: Progressing Goal: Verbalization of understanding the information provided will improve Outcome: Progressing   Problem: Health Behavior/Discharge Planning: Goal: Compliance with treatment plan for underlying cause of condition will improve Outcome: Progressing   Problem: Health Behavior/Discharge Planning: Goal: Compliance with treatment plan for underlying cause of condition will improve Outcome: Progressing   Problem: Safety: Goal: Periods of time without injury will increase Outcome: Progressing   Problem: Education: Goal: Will be free of psychotic symptoms Outcome: Progressing   Problem: Health Behavior/Discharge Planning: Goal: Compliance with prescribed medication regimen will improve Outcome: Progressing   Problem: Self-Care: Goal: Ability to participate in self-care as condition permits will improve Outcome: Progressing

## 2020-11-29 NOTE — Tx Team (Addendum)
Interdisciplinary Treatment and Diagnostic Plan Update  11/29/2020 Time of Session: 0930  Robin Wood MRN: [id number]  Principal Diagnosis: Bipolar affective disorder, current episode manic [facility])  Secondary Diagnoses: Principal Problem:   Bipolar affective disorder, current episode manic (HCC)   Current Medications:  Current Facility-Administered Medications  Medication Dose Route Frequency Provider Last Rate Last Admin  . acetaminophen (TYLENOL) tablet 650 mg  650 mg Oral Q6H PRN Thalia Party, MD   650 mg at 11/28/20 2130  . alum & mag hydroxide-simeth (MAALOX/MYLANTA) 200-200-20 MG/5ML suspension 30 mL  30 mL Oral Q4H PRN Thalia Party, MD      . hydrOXYzine (ATARAX/VISTARIL) tablet 25 mg  25 mg Oral TID PRN Thalia Party, MD      . magnesium hydroxide (MILK OF MAGNESIA) suspension 30 mL  30 mL Oral Daily PRN Paliy, Serina Cowper, MD      . risperiDONE (RISPERDAL) tablet 1 mg  1 mg Oral QHS Thalia Party, MD   1 mg at 11/28/20 2129  . traZODone (DESYREL) tablet 100 mg  100 mg Oral QHS PRN Thalia Party, MD      . ziprasidone (GEODON) capsule 20 mg  20 mg Oral Q6H PRN Thalia Party, MD      . ziprasidone (GEODON) injection 20 mg  20 mg Intramuscular Q6H PRN Thalia Party, MD       PTA Medications: Medications Prior to Admission  Medication Sig Dispense Refill Last Dose  . hydrOXYzine (ATARAX/VISTARIL) 50 MG tablet Take 1 tablet (50 mg total) by mouth 3 (three) times daily as needed for anxiety. (Patient not taking: No sig reported) 60 tablet 1   . medroxyPROGESTERone (DEPO-PROVERA) 150 MG/ML injection Inject 1 mL (150 mg total) into the muscle every 3 (three) months. 1 mL 3   . NIFEdipine (ADALAT CC) 30 MG 24 hr tablet Take 1 tablet (30 mg total) by mouth daily. (Patient not taking: No sig reported) 30 tablet 1   . risperiDONE (RISPERDAL) 2 MG tablet Take 1 tablet (2 mg total) by mouth at bedtime. (Patient not taking: No sig reported) 30 tablet 1   . sertraline (ZOLOFT) 50 MG tablet Take 1  tablet (50 mg total) by mouth daily. (Patient not taking: No sig reported) 30 tablet 1   . traZODone (DESYREL) 100 MG tablet Take 1 tablet (100 mg total) by mouth at bedtime as needed for sleep. (Patient not taking: No sig reported) 30 tablet 1     Patient Stressors: Marital or family conflict Medication change or noncompliance Substance abuse  Patient Strengths: Supportive family/friends Work skills  Treatment Modalities: Medication Management, Group therapy, Case management,  1 to 1 session with clinician, Psychoeducation, Recreational therapy.   Physician Treatment Plan for Primary Diagnosis: Bipolar affective disorder, current episode manic (HCC) Long Term Goal(s): Improvement in symptoms so as ready for discharge Improvement in symptoms so as ready for discharge   Short Term Goals: Ability to demonstrate self-control will improve Compliance with prescribed medications will improve  Medication Management: Evaluate patient's response, side effects, and tolerance of medication regimen.  Therapeutic Interventions: 1 to 1 sessions, Unit Group sessions and Medication administration.  Evaluation of Outcomes: Not Progressing  Physician Treatment Plan for Secondary Diagnosis: Principal Problem:   Bipolar affective disorder, current episode manic (HCC)  Long Term Goal(s): Improvement in symptoms so as ready for discharge Improvement in symptoms so as ready for discharge   Short Term Goals: Ability to demonstrate self-control will improve Compliance with prescribed medications will improve  Medication Management: Evaluate patient's response, side effects, and tolerance of medication regimen.  Therapeutic Interventions: 1 to 1 sessions, Unit Group sessions and Medication administration.  Evaluation of Outcomes: Not Progressing   RN Treatment Plan for Primary Diagnosis: Bipolar affective disorder, current episode manic (HCC) Long Term Goal(s): Knowledge of disease and  therapeutic regimen to maintain health will improve  Short Term Goals: Ability to verbalize frustration and anger appropriately will improve, Ability to demonstrate self-control, Ability to participate in decision making will improve, Ability to verbalize feelings will improve and Ability to identify and develop effective coping behaviors will improve  Medication Management: RN will administer medications as ordered by provider, will assess and evaluate patient's response and provide education to patient for prescribed medication. RN will report any adverse and/or side effects to prescribing provider.  Therapeutic Interventions: 1 on 1 counseling sessions, Psychoeducation, Medication administration, Evaluate responses to treatment, Monitor vital signs and CBGs as ordered, Perform/monitor CIWA, COWS, AIMS and Fall Risk screenings as ordered, Perform wound care treatments as ordered.  Evaluation of Outcomes: Not Progressing   LCSW Treatment Plan for Primary Diagnosis: Bipolar affective disorder, current episode manic (HCC) Long Term Goal(s): Safe transition to appropriate next level of care at discharge, Engage patient in therapeutic group addressing interpersonal concerns.  Short Term Goals: Engage patient in aftercare planning with referrals and resources, Increase social support, Increase ability to appropriately verbalize feelings, Increase emotional regulation, Facilitate acceptance of mental health diagnosis and concerns, Identify triggers associated with mental health/substance abuse issues and Increase skills for wellness and recovery  Therapeutic Interventions: Assess for all discharge needs, 1 to 1 time with Social worker, Explore available resources and support systems, Assess for adequacy in community support network, Educate family and significant other(s) on suicide prevention, Complete Psychosocial Assessment, Interpersonal group therapy.  Evaluation of Outcomes: Not  Progressing   Progress in Treatment: Attending groups: Yes. Participating in groups: Yes. Taking medication as prescribed: Yes. Toleration medication: Yes. Family/Significant other contact made: No, will contact:  Patient declined to provide consent.  Patient understands diagnosis: No. Discussing patient identified problems/goals with staff: No. Medical problems stabilized or resolved: No. Denies suicidal/homicidal ideation: Yes. Issues/concerns per patient self-inventory: No. Other: none   New problem(s) identified: No, Describe:  none at this time.  New Short Term/Long Term Goal(s): elimination of symptoms of psychosis, medication management for mood stabilization; development of comprehensive mental wellness plan.   Patient Goals:  Patient reported that she "does not know what you mean . . . There is nothing I want."   Discharge Plan or Barriers: No anticipated barriers at this time.   Reason for Continuation of Hospitalization: Medication stabilization Other; describe patient denies all MH related signs and symptoms.   Estimated Length of Stay:  Recreational Therapy: Patient Stressors: N/A Patient Goal: Patient will engage in groups without prompting or encouragement from LRT x3 group sessions within 5 recreation therapy group sessions.  Attendees: Patient: Robin Wood 11/29/2020 10:26 AM  Physician: Mordecai Rasmussen, MD  11/29/2020 10:26 AM  Nursing: Torrie Mayers, RN  11/29/2020 10:26 AM  RN Care Manager: 11/29/2020 10:26 AM  Social Worker: Regan Rakers, MSW, [place], Bridget Hartshorn  11/29/2020 10:26 AM  Recreational Therapist: Garret Reddish, Drue Flirt, LRT  11/29/2020 10:26 AM  Other: Jillyn Hidden, MSW, LCSW, LCAS  11/29/2020 10:26 AM  Other: Penni Homans, MSW, LCSW 11/29/2020 10:26 AM  Other: 11/29/2020 10:26 AM    Scribe for Treatment Team: Corky Crafts, LCSWA 11/29/2020 10:26 AM

## 2020-11-29 NOTE — [facility] Group Notes (Signed)
LCSW Group Therapy Note  11/29/2020 2:19 PM  Type of Therapy/Topic:  Group Therapy:  Feelings about Diagnosis  Participation Level:  Minimal   Description of Group:   This group will allow patients to explore their thoughts and feelings about diagnoses they have received. Patients will be guided to explore their level of understanding and acceptance of these diagnoses. Facilitator will encourage patients to process their thoughts and feelings about the reactions of others to their diagnosis and will guide patients in identifying ways to discuss their diagnosis with significant others in their lives. This group will be process-oriented, with patients participating in exploration of their own experiences, giving and receiving support, and processing challenge from other group members.   Therapeutic Goals: 1. Patient will demonstrate understanding of diagnosis as evidenced by identifying two or more symptoms of the disorder 2. Patient will be able to express two feelings regarding the diagnosis 3. Patient will demonstrate their ability to communicate their needs through discussion and/or role play  Summary of Patient Progress: Patient was present for the entirety of group. Patient participated in group introduction; appeared to be in good mood and spirit. Patient refrain from contributing to the topic of discussion.    Therapeutic Modalities:   Cognitive Behavioral Therapy Brief Therapy Feelings Identification   Gwenevere Ghazi, MSW, [place], [place] 11/29/2020 2:19 PM

## 2020-11-29 NOTE — Plan of Care (Signed)
  Problem: Education: Goal: Knowledge of [facility] Education information/materials will improve Outcome: Progressing Goal: Emotional status will improve Outcome: Progressing Goal: Mental status will improve Outcome: Progressing Goal: Verbalization of understanding the information provided will improve Outcome: Progressing   Problem: Health Behavior/Discharge Planning: Goal: Compliance with treatment plan for underlying cause of condition will improve Outcome: Progressing   Problem: Safety: Goal: Periods of time without injury will increase Outcome: Progressing   Problem: Education: Goal: Will be free of psychotic symptoms Outcome: Progressing   Problem: Health Behavior/Discharge Planning: Goal: Compliance with prescribed medication regimen will improve Outcome: Progressing   Problem: Self-Care: Goal: Ability to participate in self-care as condition permits will improve Outcome: Progressing   

## 2020-11-29 NOTE — Progress Notes (Signed)
Pt has been slow, apprehensive withdrawn. Pt has mainly been withdrawn today. Torrie Mayers Rn

## 2020-11-30 MED ORDER — ENSURE ENLIVE PO LIQD
237.0000 mL | Freq: Two times a day (BID) | ORAL | Status: DC
  Administered 2020-12-01 (×2): 237 mL via ORAL

## 2020-11-30 MED ORDER — ENSURE ENLIVE PO LIQD: 237.0000 mL | Freq: Two times a day (BID) | ORAL | Status: DC

## 2020-11-30 MED ORDER — RISPERIDONE 1 MG PO TABS
3.0000 mg | ORAL_TABLET | Freq: Every day | ORAL | Status: DC
  Administered 2020-11-30 – 2020-12-01 (×2): 3 mg via ORAL
  Filled 2020-11-30 (×2): qty 3

## 2020-11-30 NOTE — Plan of Care (Signed)
Patient isolates in her room most of the time.Patient verbalized anxiety states " this is environmental." Vistaril given. Denies SI,HI and AVH. Patient presents with minimal stresses states " I have stresses like every family have." Appetite and energy level good.Did not attend groups.Support and encouragement given.

## 2020-11-30 NOTE — Progress Notes (Signed)
Recreation Therapy Notes  Date: 11/30/2020  Time: 9:30 am   Location: Craft room     Behavioral response: N/A   Intervention Topic: Time Management     Discussion/Intervention: Patient did not attend group.   Clinical Observations/Feedback:  Patient did not attend group.   Kathrynne Kulinski LRT/CTRS          Holiday Mcmenamin 11/30/2020 10:50 AM

## 2020-11-30 NOTE — [facility] Counselor (Signed)
CSW met with pt briefly to discuss discharge/aftercare plans. Patient was in her bed with her eyes closed. When asked how she was doing, she stated that she was just tired. CSW inquired regarding her disposition plans, specifically if she was returning to her mother's. Patient began talking about her job and staying there until she completes orientation at a new place. Forde Dandy was refocused towards her living situation. She endorsed plans to return to her mother's upon discharge. CSW inquired regarding aftercare. Pt stated that she has aftercare already. CSW attempted to discuss this further with patient. She was willing to identify that she already has a psychiatrist near the [facility], but was unable or unwilling to identify the provider specifically. CSW inquired whether she knew if she had a scheduled appointment or if it was just a standing appointment. Patient stated that she will see them when she is discharged from here. CSW informed patient that he would follow up with her later when she was up. No other concerns expressed. Contact ended without issues. Patient appeared guarded during the interaction.   Chalmers Guest. Guerry Bruin, MSW, [place], [place] 11/30/2020 9:40 AM

## 2020-11-30 NOTE — Progress Notes (Signed)
[facility] MD Progress Note  11/30/2020 7:05 PM Robin Wood  MRN:  [id number] Subjective: Patient continues to be withdrawn most of the time.  Frequently talking to her self.  Seems to be responding to internal stimuli.  In direct conversation she minimizes symptoms has very little to say.  Denies suicidal ideation.  No side effects reported from medicine Principal Problem: Bipolar affective disorder, current episode manic (HCC) Diagnosis: Principal Problem:   Bipolar affective disorder, current episode manic (HCC)  Total Time spent with patient: 30 minutes  Past Psychiatric History: Recurrent psychotic disorder  Past Medical History:  Past Medical History:  Diagnosis Date  . Anxiety   . Insomnia     Past Surgical History:  Procedure Laterality Date  . CESAREAN SECTION N/A 07/05/2018   Procedure: CESAREAN SECTION;  Surgeon: Conard Novak, MD;  Location: [facility] ORS;  Service: Obstetrics;  Laterality: N/A;  . NO PAST SURGERIES     Family History: History reviewed. No pertinent family history. Family Psychiatric  History: See previous Social History:  Social History   Substance and Sexual Activity  Alcohol Use Yes   Comment: "not frequently"     Social History   Substance and Sexual Activity  Drug Use No    Social History   Socioeconomic History  . Marital status: Single    Spouse name: Not on file  . Number of children: 0  . Years of education: Not on file  . Highest education level: 11th grade  Occupational History  . Not on file  Tobacco Use  . Smoking status: Current Some Day Smoker    Types: Cigarettes    Last attempt to quit: 12/17/2017    Years since quitting: 2.9  . Smokeless tobacco: Never Used  Vaping Use  . Vaping Use: Every day  Substance and Sexual Activity  . Alcohol use: Yes    Comment: "not frequently"  . Drug use: No  . Sexual activity: Yes    Birth control/protection: Injection    Comment: Depo  Other Topics Concern  . Not on file   Social History Narrative  . Not on file   Social Determinants of Health   Financial Resource Strain: Not on file  Food Insecurity: Not on file  Transportation Needs: Not on file  Physical Activity: Not on file  Stress: Not on file  Social Connections: Not on file   Additional Social History:                         Sleep: Fair  Appetite:  Fair  Current Medications: Current Facility-Administered Medications  Medication Dose Route Frequency Provider Last Rate Last Admin  . acetaminophen (TYLENOL) tablet 650 mg  650 mg Oral Q6H PRN Thalia Party, MD   650 mg at 11/28/20 2130  . alum & mag hydroxide-simeth (MAALOX/MYLANTA) 200-200-20 MG/5ML suspension 30 mL  30 mL Oral Q4H PRN Thalia Party, MD      . Melene Muller ON 12/01/2020] feeding supplement (ENSURE ENLIVE / ENSURE PLUS) liquid 237 mL  237 mL Oral BID BM Alle Difabio T, MD      . Melene Muller ON 12/01/2020] feeding supplement (ENSURE ENLIVE / ENSURE PLUS) liquid 237 mL  237 mL Oral BID BM Tyrene Nader T, MD      . hydrOXYzine (ATARAX/VISTARIL) tablet 25 mg  25 mg Oral TID PRN Thalia Party, MD   25 mg at 11/30/20 1641  . magnesium hydroxide (MILK OF MAGNESIA) suspension 30 mL  30 mL Oral Daily PRN Larita Fife, MD      . risperiDONE (RISPERDAL) tablet 3 mg  3 mg Oral QHS Thy Gullikson T, MD      . traZODone (DESYREL) tablet 100 mg  100 mg Oral QHS PRN Larita Fife, MD   100 mg at 11/29/20 2120  . ziprasidone (GEODON) capsule 20 mg  20 mg Oral Q6H PRN Larita Fife, MD      . ziprasidone (GEODON) injection 20 mg  20 mg Intramuscular Q6H PRN Larita Fife, MD        Lab Results: No results found for this or any previous visit (from the past 48 hour(s)).  Blood Alcohol level:  Lab Results  Component Value Date   ETH <10 11/26/2020   ETH <10 07/37/1062    Metabolic Disorder Labs: No results found for: HGBA1C, MPG No results found for: PROLACTIN Lab Results  Component Value Date   CHOL 152 09/26/2020   TRIG 46 09/26/2020   HDL  50 09/26/2020   CHOLHDL 3.0 09/26/2020   VLDL 9 09/26/2020   LDLCALC 93 09/26/2020    Physical Findings: AIMS:  , ,  ,  ,    CIWA:    COWS:     Musculoskeletal: Strength & Muscle Tone: within normal limits Gait & Station: normal Patient leans: N/A  Psychiatric Specialty Exam: Physical Exam Vitals and nursing note reviewed.  Constitutional:      Appearance: She is well-developed and well-nourished.  HENT:     Head: Normocephalic and atraumatic.  Eyes:     Conjunctiva/sclera: Conjunctivae normal.     Pupils: Pupils are equal, round, and reactive to light.  Cardiovascular:     Heart sounds: Normal heart sounds.  Pulmonary:     Effort: Pulmonary effort is normal.  Abdominal:     Palpations: Abdomen is soft.  Musculoskeletal:        General: Normal range of motion.     Cervical back: Normal range of motion.  Skin:    General: Skin is warm and dry.  Neurological:     General: No focal deficit present.     Mental Status: She is alert.  Psychiatric:        Attention and Perception: She is inattentive.        Mood and Affect: Affect is blunt.        Speech: Speech is tangential.        Thought Content: Thought content is delusional.        Cognition and Memory: Cognition is impaired.        Judgment: Judgment is impulsive.     Review of Systems  Constitutional: Negative.   HENT: Negative.   Eyes: Negative.   Respiratory: Negative.   Cardiovascular: Negative.   Gastrointestinal: Negative.   Musculoskeletal: Negative.   Skin: Negative.   Neurological: Negative.   Psychiatric/Behavioral: Positive for confusion.    Blood pressure (!) 112/93, pulse 88, temperature 98.1 F (36.7 C), temperature source Oral, resp. rate 18, height 5\' 2"  (1.575 m), weight 38.6 kg, SpO2 94 %, unknown if currently breastfeeding.Body mass index is 15.55 kg/m.  General Appearance: Casual  Eye Contact:  Good  Speech:  Clear and Coherent  Volume:  Normal  Mood:  Euthymic  Affect:   Congruent  Thought Process:  Goal Directed  Orientation:  Full (Time, Place, and Person)  Thought Content:  Logical  Suicidal Thoughts:  No  Homicidal Thoughts:  No  Memory:  Immediate;   Fair  Recent;   Fair Remote;   Fair  Judgement:  Fair  Insight:  Fair  Psychomotor Activity:  Normal  Concentration:  Concentration: Fair  Recall:  [employer] of Knowledge:  Fair  Language:  Fair  Akathisia:  No  Handed:  Right  AIMS (if indicated):     Assets:  Desire for Improvement  ADL's:  Intact  Cognition:  Impaired,  Mild  Sleep:  Number of Hours: 6.45     Treatment Plan Summary: Plan Increase Risperdal to 3 mg at night.  Encourage group attendance.  Continue monitoring and trying to get her to be able to articulate an appropriate outpatient plan.  Mordecai Rasmussen, MD 11/30/2020, 7:05 PM

## 2020-11-30 NOTE — BHH Group Notes (Signed)
LCSW Group Therapy Note  11/30/2020 1:28 PM  Type of Therapy/Topic:  Group Therapy:  Emotion Regulation  Participation Level:  Did Not Attend   Description of Group:   The purpose of this group is to assist patients in learning to regulate negative emotions and experience positive emotions. Patients will be guided to discuss ways in which they have been vulnerable to their negative emotions. These vulnerabilities will be juxtaposed with experiences of positive emotions or situations, and patients will be challenged to use positive emotions to combat negative ones. Special emphasis will be placed on coping with negative emotions in conflict situations, and patients will process healthy conflict resolution skills.  Therapeutic Goals: 1. Patient will identify two positive emotions or experiences to reflect on in order to balance out negative emotions 2. Patient will label two or more emotions that they find the most difficult to experience 3. Patient will demonstrate positive conflict resolution skills through discussion and/or role plays  Summary of Patient Progress: X  Therapeutic Modalities:   Cognitive Behavioral Therapy Feelings Identification Dialectical Behavioral Therapy  Mandolin Falwell R. Algis Greenhouse, MSW, LCSW, LCAS 11/30/2020 1:28 PM

## 2020-12-01 NOTE — Progress Notes (Signed)
[facility] MD Progress Note  12/01/2020 7:17 PM Robin Wood  MRN:  [id number] Subjective: Follow-up for this young woman with psychotic disorder.  Patient has no new complaints.  She takes care of her hygiene and she moves around the unit although she mostly stays to herself.  I ask her about the episodes that nursing describes of her talking to herself and she denied doing it.  Denied hallucinations.  Minimizes symptoms.  When asked open-ended questions becomes rather vague but did not say anything bizarre.  Denies any dangerous thought. Principal Problem: Bipolar affective disorder, current episode manic ([place]) Diagnosis: Principal Problem:   Bipolar affective disorder, current episode manic ([place])  Total Time spent with patient: 30 minutes  Past Psychiatric History: Past history of schizophrenia  Past Medical History:  Past Medical History:  Diagnosis Date  . Anxiety   . Insomnia     Past Surgical History:  Procedure Laterality Date  . CESAREAN SECTION N/A 07/05/2018   Procedure: CESAREAN SECTION;  Surgeon: Will Bonnet, MD;  Location: [facility] ORS;  Service: Obstetrics;  Laterality: N/A;  . NO PAST SURGERIES     Family History: History reviewed. No pertinent family history. Family Psychiatric  History: See previous Social History:  Social History   Substance and Sexual Activity  Alcohol Use Yes   Comment: "not frequently"     Social History   Substance and Sexual Activity  Drug Use No    Social History   Socioeconomic History  . Marital status: Single    Spouse name: Not on file  . Number of children: 0  . Years of education: Not on file  . Highest education level: 11th grade  Occupational History  . Not on file  Tobacco Use  . Smoking status: Current Some Day Smoker    Types: Cigarettes    Last attempt to quit: 12/17/2017    Years since quitting: 2.9  . Smokeless tobacco: Never Used  Vaping Use  . Vaping Use: Every day  Substance and Sexual Activity  .  Alcohol use: Yes    Comment: "not frequently"  . Drug use: No  . Sexual activity: Yes    Birth control/protection: Injection    Comment: Depo  Other Topics Concern  . Not on file  Social History Narrative  . Not on file   Social Determinants of Health   Financial Resource Strain: Not on file  Food Insecurity: Not on file  Transportation Needs: Not on file  Physical Activity: Not on file  Stress: Not on file  Social Connections: Not on file   Additional Social History:                         Sleep: Fair  Appetite:  Fair  Current Medications: Current Facility-Administered Medications  Medication Dose Route Frequency Provider Last Rate Last Admin  . acetaminophen (TYLENOL) tablet 650 mg  650 mg Oral Q6H PRN Larita Fife, MD   650 mg at 11/28/20 2130  . alum & mag hydroxide-simeth (MAALOX/MYLANTA) 200-200-20 MG/5ML suspension 30 mL  30 mL Oral Q4H PRN Paliy, Alisa, MD      . feeding supplement (ENSURE ENLIVE / ENSURE PLUS) liquid 237 mL  237 mL Oral BID BM Webster Patrone T, MD      . feeding supplement (ENSURE ENLIVE / ENSURE PLUS) liquid 237 mL  237 mL Oral BID BM Ravis Herne T, MD   237 mL at 12/01/20 1600  . hydrOXYzine (ATARAX/VISTARIL)  tablet 25 mg  25 mg Oral TID PRN Thalia Party, MD   25 mg at 11/30/20 1641  . magnesium hydroxide (MILK OF MAGNESIA) suspension 30 mL  30 mL Oral Daily PRN Thalia Party, MD      . risperiDONE (RISPERDAL) tablet 3 mg  3 mg Oral QHS Ronrico Dupin, Jackquline Denmark, MD   3 mg at 11/30/20 2120  . traZODone (DESYREL) tablet 100 mg  100 mg Oral QHS PRN Thalia Party, MD   100 mg at 11/29/20 2120  . ziprasidone (GEODON) capsule 20 mg  20 mg Oral Q6H PRN Thalia Party, MD      . ziprasidone (GEODON) injection 20 mg  20 mg Intramuscular Q6H PRN Thalia Party, MD        Lab Results: No results found for this or any previous visit (from the past 48 hour(s)).  Blood Alcohol level:  Lab Results  Component Value Date   ETH <10 11/26/2020   ETH <10  09/25/2020    Metabolic Disorder Labs: No results found for: HGBA1C, MPG No results found for: PROLACTIN Lab Results  Component Value Date   CHOL 152 09/26/2020   TRIG 46 09/26/2020   HDL 50 09/26/2020   CHOLHDL 3.0 09/26/2020   VLDL 9 09/26/2020   LDLCALC 93 09/26/2020    Physical Findings: AIMS:  , ,  ,  ,    CIWA:    COWS:     Musculoskeletal: Strength & Muscle Tone: within normal limits Gait & Station: normal Patient leans: N/A  Psychiatric Specialty Exam: Physical Exam Vitals and nursing note reviewed.  Constitutional:      Appearance: She is well-developed and well-nourished.  HENT:     Head: Normocephalic and atraumatic.  Eyes:     Conjunctiva/sclera: Conjunctivae normal.     Pupils: Pupils are equal, round, and reactive to light.  Cardiovascular:     Heart sounds: Normal heart sounds.  Pulmonary:     Effort: Pulmonary effort is normal.  Abdominal:     Palpations: Abdomen is soft.  Musculoskeletal:        General: Normal range of motion.     Cervical back: Normal range of motion.  Skin:    General: Skin is warm and dry.  Neurological:     General: No focal deficit present.     Mental Status: She is alert.  Psychiatric:        Attention and Perception: She is inattentive.        Mood and Affect: Affect is blunt.        Speech: Speech is tangential.     Review of Systems  Constitutional: Negative.   HENT: Negative.   Eyes: Negative.   Respiratory: Negative.   Cardiovascular: Negative.   Gastrointestinal: Negative.   Musculoskeletal: Negative.   Skin: Negative.   Neurological: Negative.   Psychiatric/Behavioral: Negative.     Blood pressure (!) 112/93, pulse 88, temperature 98.1 F (36.7 C), temperature source Oral, resp. rate 18, height 5\' 2"  (1.575 m), weight 38.6 kg, SpO2 94 %, unknown if currently breastfeeding.Body mass index is 15.55 kg/m.  General Appearance: Casual  Eye Contact:  Fair  Speech:  Clear and Coherent  Volume:   Decreased  Mood:  Euthymic  Affect:  Congruent  Thought Process:  Goal Directed  Orientation:  Full (Time, Place, and Person)  Thought Content:  Logical  Suicidal Thoughts:  No  Homicidal Thoughts:  No  Memory:  Immediate;   Fair Recent;   Fair Remote;  Fair  Judgement:  Fair  Insight:  Fair  Psychomotor Activity:  Decreased  Concentration:  Concentration: Fair  Recall:  [employer] of Knowledge:  Fair  Language:  Fair  Akathisia:  No  Handed:  Right  AIMS (if indicated):     Assets:  Physical Health  ADL's:  Intact  Cognition:  Impaired,  Mild  Sleep:  Number of Hours: 7.45     Treatment Plan Summary: Plan Probably still having some psychotic psychotic symptoms but has poor insight.  Not acting acutely dangerously.  Taking medicine.  Reviewed medicine with the patient.  May be able to look at discharge by later tomorrow  Mordecai Rasmussen, MD 12/01/2020, 7:17 PM

## 2020-12-01 NOTE — Progress Notes (Signed)
Recreation Therapy Notes   Date: 12/02/2019  Time: 9:30 am   Location: Craft room   Behavioral response: Withdrawn, Isolated,Not engaged  Intervention Topic: Leisure   Discussion/Intervention:  Group content today was focused on leisure. The group defined what leisure is and some positive leisure activities they participate in. Individuals identified the difference between good and bad leisure. Participants expressed how they feel after participating in the leisure of their choice. The group discussed how they go about picking a leisure activity and if others are involved in their leisure activities. The patient stated how many leisure activities they have to choose from and reasons why it is important to have leisure time. Individuals participated in the intervention "What's the Title" where they had a chance to identify new leisure activities as well as benefits of leisure.  Clinical Observations/Feedback: Patient came to group but did not participate in the group discussion or intervention. Individual was not engaged in the intervention and refused to socialize with peers or staff.   Robin Wood LRT/CTRS         Alesha Jaffee 12/01/2020 12:07 PM

## 2020-12-01 NOTE — BHH Counselor (Signed)
CSW met with patient to discuss aftercare.  Patient was disorganized and struggled with understanding purpose of the conversation.  CSW had to reframe several times for the patient to understand. Patient stated that she has an appointment and it is "against policy" for CSW to call and confirm appointment.  Patient did not appear to fully comprehend that CSW wanted to confirm, she stated several times that she would not sign the paperwork because it had "[place] only on it". CSW explained that the form said this because this is where the patient was located, however, pt continued to struggle.  Assunta Curtis, MSW, LCSW 12/01/2020 9:31 AM

## 2020-12-01 NOTE — Plan of Care (Signed)
  Problem: Education: Goal: Knowledge of [facility] Education information/materials will improve Outcome: Progressing Goal: Emotional status will improve Outcome: Progressing Goal: Mental status will improve Outcome: Progressing Goal: Verbalization of understanding the information provided will improve Outcome: Progressing   Problem: Health Behavior/Discharge Planning: Goal: Compliance with treatment plan for underlying cause of condition will improve Outcome: Progressing   Problem: Safety: Goal: Periods of time without injury will increase Outcome: Progressing   Problem: Education: Goal: Will be free of psychotic symptoms Outcome: Progressing   Problem: Health Behavior/Discharge Planning: Goal: Compliance with prescribed medication regimen will improve Outcome: Progressing   Problem: Self-Care: Goal: Ability to participate in self-care as condition permits will improve Outcome: Progressing

## 2020-12-01 NOTE — BHH Group Notes (Signed)
LCSW Group Therapy Note  12/01/2020 1:30 PM  Type of Therapy/Topic:  Group Therapy:  Balance in Life  Participation Level:  Did Not Attend  Description of Group:    This group will address the concept of balance and how it feels and looks when one is unbalanced. Patients will be encouraged to process areas in their lives that are out of balance and identify reasons for remaining unbalanced. Facilitators will guide patients in utilizing problem-solving interventions to address and correct the stressor making their life unbalanced. Understanding and applying boundaries will be explored and addressed for obtaining and maintaining a balanced life. Patients will be encouraged to explore ways to assertively make their unbalanced needs known to significant others in their lives, using other group members and facilitator for support and feedback.  Therapeutic Goals: 1. Patient will identify two or more emotions or situations they have that consume much of in their lives. 2. Patient will identify signs/triggers that life has become out of balance:  3. Patient will identify two ways to set boundaries in order to achieve balance in their lives:  4. Patient will demonstrate ability to communicate their needs through discussion and/or role plays  Summary of Patient Progress: X  Therapeutic Modalities:   Cognitive Behavioral Therapy Solution-Focused Therapy Assertiveness Training  Simona Huh R. Algis Greenhouse, MSW, LCSW, LCAS 12/01/2020 1:30 PM

## 2020-12-01 NOTE — Progress Notes (Signed)
Pt has been calm and withdrawn. Pt attended group one time today. Pt still denies everything. Pt has no eye contact. Pt may have called 911 but she denied. Pt is safe with no complaints. Torrie Mayers RN

## 2020-12-01 NOTE — Progress Notes (Signed)
Patient is pleasant cooperative. She has been active on the unit this evening and engages well with other peers on the unit. She denies SI  HI  AVH  depression anxiety and pain at this encounter. She is med compliant and received her QHS meds without incident. She continues to show response to internal stimuli, talking to her self while in her room, but will deny when questioned. She is no behavioral issue on the unit and actually shows a brighter affect. She remains safe on the unit and was encouraged to come to staff with any concerns.     Cleo Butler-Nicholson, LPN

## 2020-12-01 NOTE — Plan of Care (Signed)
Pt denies depression, anxiety, SI, HI and AVH. Pt was educated on care plan and verbalizes understanding. Torrie Mayers RN Problem: Education: Goal: [employer] Education information/materials will improve Outcome: Progressing Goal: Emotional status will improve Outcome: Not Progressing Goal: Mental status will improve Outcome: Not Progressing Goal: Verbalization of understanding the information provided will improve Outcome: Not Progressing   Problem: Health Behavior/Discharge Planning: Goal: Compliance with treatment plan for underlying cause of condition will improve Outcome: Progressing   Problem: Safety: Goal: Periods of time without injury will increase Outcome: Progressing   Problem: Education: Goal: Will be free of psychotic symptoms Outcome: Not Progressing   Problem: Health Behavior/Discharge Planning: Goal: Compliance with prescribed medication regimen will improve Outcome: Progressing   Problem: Self-Care: Goal: Ability to participate in self-care as condition permits will improve Outcome: Progressing

## 2020-12-02 MED ORDER — RISPERIDONE 3 MG PO TABS: 3.0000 mg | ORAL_TABLET | Freq: Every day | ORAL | 1 refills | Status: AC

## 2020-12-02 MED ORDER — TRAZODONE HCL 100 MG PO TABS: 100.0000 mg | ORAL_TABLET | Freq: Every evening | ORAL | 1 refills | Status: AC | PRN

## 2020-12-02 NOTE — Discharge Summary (Signed)
Physician Discharge Summary Note  Patient:  Robin Wood is an 25 y.o., female MRN:  [id number] DOB:  1996-01-13 Patient phone:  [phone] (home)  Patient address:   [address] [place] [address],  Total Time spent with patient: 30 minutes  Date of Admission:  11/27/2020 Date of Discharge: 12/02/2020  Reason for Admission: Patient was admitted after an episode of agitation and bizarre behavior at home with conflict with her mother.  Concern was for psychosis and agitation  Principal Problem: Bipolar affective disorder, current episode manic [facility]) Discharge Diagnoses: Principal Problem:   Bipolar affective disorder, current episode manic (HCC)   Past Psychiatric History: History of a diagnosis of bipolar disorder with previous hospitalizations  Past Medical History:  Past Medical History:  Diagnosis Date   Anxiety    Insomnia     Past Surgical History:  Procedure Laterality Date   CESAREAN SECTION N/A 07/05/2018   Procedure: CESAREAN SECTION;  Surgeon: Conard Novak, MD;  Location: [facility] ORS;  Service: Obstetrics;  Laterality: N/A;   NO PAST SURGERIES     Family History: History reviewed. No pertinent family history. Family Psychiatric  History: See previous Social History:  Social History   Substance and Sexual Activity  Alcohol Use Yes   Comment: "not frequently"     Social History   Substance and Sexual Activity  Drug Use No    Social History   Socioeconomic History   Marital status: Single    Spouse name: Not on file   Number of children: 0   Years of education: Not on file   Highest education level: 11th grade  Occupational History   Not on file  Tobacco Use   Smoking status: Current Some Day Smoker    Types: Cigarettes    Last attempt to quit: 12/17/2017    Years since quitting: 2.9   Smokeless tobacco: Never Used  Vaping Use   Vaping Use: Every day  Substance and Sexual Activity   Alcohol use: Yes     Comment: "not frequently"   Drug use: No   Sexual activity: Yes    Birth control/protection: Injection    Comment: Depo  Other Topics Concern   Not on file  Social History Narrative   Not on file   Social Determinants of Health   Financial Resource Strain: Not on file  Food Insecurity: Not on file  Transportation Needs: Not on file  Physical Activity: Not on file  Stress: Not on file  Social Connections: Not on file    Hospital Course: Patient was maintained on 15-minute checks.  She was started on modest dose Risperdal which was gradually increased to 3 mg at night.  Patient appeared to be compliant with medication.  She was able to take care of her ADLs her hygiene and her basic needs without difficulty.  For the most part she stays to herself often being seen by staff in her room who have on several occasions described her as still talking to herself and appearing to respond to internal stimuli.  In conversation with me she denies having any hallucinations and denies having any feeling of disorganization in her thinking.  She has not shown any dangerous behavior in the hospital.  She is able at the time of discharge to articulate a plan to follow-up with outpatient treatment with [facility] and to go back to her family home and start working at [employer].  Not making any bizarre or threatening statements.  Insight  is still somewhat limited but overall no longer seems to meet commitment criteria.  Psychoeducation was completed regarding the importance of follow-up outpatient treatment.  Physical Findings: AIMS:  , ,  ,  ,    CIWA:    COWS:     Musculoskeletal: Strength & Muscle Tone: within normal limits Gait & Station: normal Patient leans: N/A  Psychiatric Specialty Exam: Physical Exam Vitals and nursing note reviewed.  Constitutional:      Appearance: She is well-developed and well-nourished.  HENT:     Head: Normocephalic and atraumatic.  Eyes:     Conjunctiva/sclera:  Conjunctivae normal.     Pupils: Pupils are equal, round, and reactive to light.  Cardiovascular:     Heart sounds: Normal heart sounds.  Pulmonary:     Effort: Pulmonary effort is normal.  Abdominal:     Palpations: Abdomen is soft.  Musculoskeletal:        General: Normal range of motion.     Cervical back: Normal range of motion.  Skin:    General: Skin is warm and dry.  Neurological:     General: No focal deficit present.     Mental Status: She is alert.  Psychiatric:        Mood and Affect: Mood normal.     Review of Systems  Constitutional: Negative.   HENT: Negative.   Eyes: Negative.   Respiratory: Negative.   Cardiovascular: Negative.   Gastrointestinal: Negative.   Musculoskeletal: Negative.   Skin: Negative.   Neurological: Negative.   Psychiatric/Behavioral: Negative.     Blood pressure (!) 112/93, pulse 88, temperature 98.1 F (36.7 C), temperature source Oral, resp. rate 18, height 5\' 2"  (1.575 m), weight 38.6 kg, SpO2 94 %, unknown if currently breastfeeding.Body mass index is 15.55 kg/m.  General Appearance: Casual  Eye Contact:  Good  Speech:  Clear and Coherent  Volume:  Normal  Mood:  Euthymic  Affect:  Congruent  Thought Process:  Goal Directed  Orientation:  Full (Time, Place, and Person)  Thought Content:  Logical  Suicidal Thoughts:  No  Homicidal Thoughts:  No  Memory:  Immediate;   Fair Recent;   Fair Remote;   Fair  Judgement:  Fair  Insight:  Fair  Psychomotor Activity:  Normal  Concentration:  Concentration: Fair  Recall:  [place] of Knowledge:  Fair  Language:  Fair  Akathisia:  No  Handed:  Right  AIMS (if indicated):     Assets:  Desire for Improvement  ADL's:  Intact  Cognition:  WNL  Sleep:  Number of Hours: 7.75     Have you used any form of tobacco in the last 30 days? (Cigarettes, Smokeless Tobacco, Cigars, and/or Pipes): Yes  Has this patient used any form of tobacco in the last 30 days? (Cigarettes, Smokeless  Tobacco, Cigars, and/or Pipes) Yes, No  Blood Alcohol level:  Lab Results  Component Value Date   ETH <10 11/26/2020   ETH <10 13/06/6577    Metabolic Disorder Labs:  No results found for: HGBA1C, MPG No results found for: PROLACTIN Lab Results  Component Value Date   CHOL 152 09/26/2020   TRIG 46 09/26/2020   HDL 50 09/26/2020   CHOLHDL 3.0 09/26/2020   VLDL 9 09/26/2020   [place] 93 09/26/2020    See Psychiatric Specialty Exam and Suicide Risk Assessment completed by Attending Physician prior to discharge.  Discharge destination:  Home  Is patient on multiple antipsychotic therapies at discharge:  No   Has Patient had three or more failed trials of antipsychotic monotherapy by history:  No  Recommended Plan for Multiple Antipsychotic Therapies: NA  Discharge Instructions    Diet - low sodium heart healthy   Complete by: As directed    Increase activity slowly   Complete by: As directed      Allergies as of 12/02/2020      Reactions   Penicillins Hives      Medication List    STOP taking these medications   hydrOXYzine 50 MG tablet Commonly known as: ATARAX/VISTARIL   NIFEdipine 30 MG 24 hr tablet Commonly known as: ADALAT CC   sertraline 50 MG tablet Commonly known as: ZOLOFT     TAKE these medications     Indication  medroxyPROGESTERone 150 MG/ML injection Commonly known as: DEPO-PROVERA Inject 1 mL (150 mg total) into the muscle every 3 (three) months.  Indication: Birth Control Treatment   risperiDONE 3 MG tablet Commonly known as: RISPERDAL Take 1 tablet (3 mg total) by mouth at bedtime. What changed:   medication strength  how much to take  Indication: MIXED BIPOLAR AFFECTIVE DISORDER   traZODone 100 MG tablet Commonly known as: DESYREL Take 1 tablet (100 mg total) by mouth at bedtime as needed for sleep.  Indication: Trouble Sleeping       Follow-up Information    [facility] Follow up on 12/09/2020.   Why: Your  appointment is scheduled for Friday, 12/09/2020 at 9:30am with [facility] via Zoom.  Contact information: [address] [place] [address] [phone]               Follow-up recommendations:  Activity:  Activity as tolerated Diet:  Regular diet Other:  Follow-up outpatient treatment with [facility] and continue medication  Comments: Prescriptions provided at discharge  Signed: Mordecai Rasmussen, MD 12/02/2020, 11:38 AM

## 2020-12-02 NOTE — Progress Notes (Signed)
Recreation Therapy Notes  INPATIENT RECREATION TR PLAN  Patient Details Name: Robin Wood MRN: [id number] DOB: 12-11-1995 Today's Date: 12/02/2020  Rec Therapy Plan Is patient appropriate for Therapeutic Recreation?: Yes Treatment times per week: at least 3 Estimated Length of Stay: 5-7 days TR Treatment/Interventions: Group participation (Comment)  Discharge Criteria Pt will be discharged from therapy if:: Discharged Treatment plan/goals/alternatives discussed and agreed upon by:: Patient/family  Discharge Summary Short term goals set: Patient will engage in interactions with peers and staff in pro-social manner at least 2x within 5 recreation therapy group sessions Short term goals met: Adequate for discharge Progress toward goals comments: Groups attended Which groups?: Leisure education,Other (Comment) (Relaxation) Reason goals not met: N/A Therapeutic equipment acquired: N/A Reason patient discharged from therapy: Discharge from hospital Pt/family agrees with progress & goals achieved: Yes Date patient discharged from therapy: 12/02/20   Zakaiya Lares 12/02/2020, 11:49 AM

## 2020-12-02 NOTE — Progress Notes (Signed)
Patient denies SI/HI, denies A/V hallucinations. Patient verbalizes understanding of discharge instructions, follow up care and prescriptions. Patient given all belongings from [facility] locker. Patient escorted out by staff, transported by safe transport. 

## 2020-12-02 NOTE — Progress Notes (Signed)
Recreation Therapy Notes   Date: 12/02/2020  Time: 9:30 am   Location: Craft room     Behavioral response: N/A   Intervention Topic: Communication      Discussion/Intervention: Patient did not attend group.   Clinical Observations/Feedback:  Patient did not attend group.   Brekyn Huntoon LRT/CTRS        Pamla Pangle 12/02/2020 11:51 AM

## 2020-12-02 NOTE — Progress Notes (Signed)
  [facility] Adult Case Management Discharge Plan :  Will you be returning to the same living situation after discharge:  Yes,  pt plans to return to her mother's home.  At discharge, do you have transportation home?: Yes,  she states a friend is coming to pick her up. Do you have the ability to pay for your medications: Yes,  Medicaid.  Release of information consent forms completed and in the chart;  Patient's signature needed at discharge.  Patient to Follow up at:  Follow-up Information    [facility] Follow up on 12/09/2020.   Why: Your appointment is scheduled for Friday, 12/09/2020 at 9:30am with [facility] via Zoom.  Contact information: [address] [place] [address] [phone]               Next level of care provider has access to [facility] Link:no  Safety Planning and Suicide Prevention discussed: Yes,  completed with pt as she declined familial/collateral contact.  Have you used any form of tobacco in the last 30 days? (Cigarettes, Smokeless Tobacco, Cigars, and/or Pipes): Yes  Has patient been referred to the Quitline?: Patient refused referral  Patient has been referred for addiction treatment: Pt. refused referral  Glenis Smoker, LCSW 12/02/2020, 11:45 AM

## 2020-12-02 NOTE — Plan of Care (Signed)
  Problem: Group Participation Goal: STG - Patient will engage in interactions with peers and staff in pro-social manner at least 2x within 5 recreation therapy group sessions Description: STG - Patient will engage in interactions with peers and staff in pro-social manner at least 2x within 5 recreation therapy group sessions 12/02/2020 1148 by Alveria Apley, LRT Outcome: Adequate for Discharge 12/02/2020 1148 by Alveria Apley, LRT Outcome: Adequate for Discharge

## 2020-12-02 NOTE — BHH Suicide Risk Assessment (Signed)
[facility] Discharge Suicide Risk Assessment   Principal Problem: Bipolar affective disorder, current episode manic [facility]) Discharge Diagnoses: Principal Problem:   Bipolar affective disorder, current episode manic (HCC)   Total Time spent with patient: 30 minutes  Musculoskeletal: Strength & Muscle Tone: within normal limits Gait & Station: normal Patient leans: N/A  Psychiatric Specialty Exam: Review of Systems  Constitutional: Negative.   HENT: Negative.   Eyes: Negative.   Respiratory: Negative.   Cardiovascular: Negative.   Gastrointestinal: Negative.   Musculoskeletal: Negative.   Skin: Negative.   Neurological: Negative.   Psychiatric/Behavioral: Negative.     Blood pressure (!) 112/93, pulse 88, temperature 98.1 F (36.7 C), temperature source Oral, resp. rate 18, height 5\' 2"  (1.575 m), weight 38.6 kg, SpO2 94 %, unknown if currently breastfeeding.Body mass index is 15.55 kg/m.  General Appearance: Casual  Eye Contact::  Good  Speech:  Clear and Coherent409  Volume:  Normal  Mood:  Euthymic  Affect:  Constricted  Thought Process:  Coherent  Orientation:  Full (Time, Place, and Person)  Thought Content:  Logical  Suicidal Thoughts:  No  Homicidal Thoughts:  No  Memory:  Immediate;   Fair Recent;   Fair Remote;   Fair  Judgement:  Fair  Insight:  Fair  Psychomotor Activity:  Normal  Concentration:  Fair  Recall:  [id number] of Knowledge:Fair  Language: Fair  Akathisia:  No  Handed:  Right  AIMS (if indicated):     Assets:  Desire for Improvement Housing Physical Health Resilience Social Support  Sleep:  Number of Hours: 7.75  Cognition: WNL  ADL's:  Intact   Mental Status Per Nursing Assessment::   On Admission:  NA  Demographic Factors:  Adolescent or young adult  Loss Factors: NA  Historical Factors: Impulsivity  Risk Reduction Factors:   Responsible for children under 8 years of age, Employed, Living with another person, especially a  relative and Positive social support  Continued Clinical Symptoms:  Bipolar Disorder:   Mixed State  Cognitive Features That Contribute To Risk:  None    Suicide Risk:  Minimal: No identifiable suicidal ideation.  Patients presenting with no risk factors but with morbid ruminations; may be classified as minimal risk based on the severity of the depressive symptoms   Follow-up Information    [facility] Follow up on 12/09/2020.   Why: Your appointment is scheduled for Friday, 12/09/2020 at 9:30am with [facility] via Zoom.  Contact information: [address] [place] [phone]               Plan Of Care/Follow-up recommendations:  Activity:  Activity as tolerated Diet:  Regular diet Other:  Follow-up with [facility] and continue current medicine  [phone], MD 12/02/2020, 11:29 AM

## 2021-06-07 ENCOUNTER — Encounter: Payer: Medicaid Other | Admitting: Advanced Practice Midwife

## 2024-06-29 ENCOUNTER — Encounter: Payer: Self-pay | Admitting: Emergency Medicine

## 2024-06-29 ENCOUNTER — Ambulatory Visit
Admission: EM | Admit: 2024-06-29 | Discharge: 2024-06-29 | Disposition: A | Discharge status: Home / Self Care | Payer: MEDICAID | Attending: Emergency Medicine | Admitting: Emergency Medicine

## 2024-06-29 DIAGNOSIS — Z202 Contact with and (suspected) exposure to infections with a predominantly sexual mode of transmission: Secondary | ICD-10-CM | POA: Diagnosis not present

## 2024-06-29 LAB — HIV ANTIBODY (ROUTINE TESTING W REFLEX): HIV Screen 4th Generation wRfx: NONREACTIVE

## 2024-06-29 MED ORDER — GENTAMICIN SULFATE 40 MG/ML IJ SOLN
240.0000 mg | Freq: Once | INTRAMUSCULAR | Status: AC
  Administered 2024-06-29: 240 mg via INTRAMUSCULAR

## 2024-06-29 MED ORDER — AZITHROMYCIN 500 MG PO TABS
2000.0000 mg | ORAL_TABLET | Freq: Once | ORAL | Status: AC
  Administered 2024-06-29: 2000 mg via ORAL

## 2024-06-29 NOTE — Discharge Instructions (Addendum)
 We have treated you presumptively today for gonorrhea and chlamydia with an injection here in clinic.  Take the doxycycline 100 mg twice daily with food for 7 days to cover for potential chlamydia.  Your vaginal cytology swab will be back in the next 1 to 2 days, along with your blood work, and if you test positive for any additional infection she will be contacted by phone and treatment options will be provided.  If your results are negative they will appear in your MyChart.  I encourage you to encourage her boyfriend to be tested.  Abstain from intercourse, or at least unprotected intercourse, until after both of you have been tested and treated for your infections.

## 2024-06-29 NOTE — ED Triage Notes (Signed)
 Pt presents with vaginal discharge and abdominal pain x 2 days. Pt noticed her boyfriend had green discharge coming from his penis about 2 weeks. She is unsure if he had any testing done.

## 2024-06-29 NOTE — ED Provider Notes (Signed)
 [facility]    CSN: [id number] Arrival date & time: 06/29/24  1700      History   Chief Complaint Chief Complaint  Patient presents with   Abdominal Pain   Vaginal Discharge    HPI Robin Wood is a 28 y.o. female.   HPI  28 year old female with past medical history significant for anxiety, bipolar disorder, MDD, cocaine use presents for evaluation of a whitish-gray thin vaginal discharge without odor that started 2 days ago.  Also suprapubic abdominal pain.  No nausea, vomiting, or urinary symptoms.  She reports that she just got back together with her ex-boyfriend and noticed that he was experiencing dysuria this morning when he was urinating.  She also noticed green discharge from his penis.  This was after they had unprotected intercourse.  Past Medical History:  Diagnosis Date   Anxiety    Insomnia     Patient Active Problem List   Diagnosis Date Noted   Bipolar affective disorder, current episode manic (HCC) 11/27/2020   MDD (major depressive disorder), recurrent, severe, with psychosis (HCC) 09/29/2020   Cocaine use disorder (HCC) 09/26/2020   Malpresentation of fetus, antepartum 07/05/2018   Chlamydia infection affecting pregnancy in third trimester, antepartum 07/05/2018   Trichomonas vaginitis 07/05/2018   Labor and delivery, indication for care 07/03/2018   Indication for care in labor and delivery, antepartum 04/16/2018    Past Surgical History:  Procedure Laterality Date   CESAREAN SECTION N/A 07/05/2018   Procedure: CESAREAN SECTION;  Surgeon: Leonce Garnette BIRCH, MD;  Location: [facility] ORS;  Service: Obstetrics;  Laterality: N/A;   NO PAST SURGERIES      OB History     Gravida  2   Para  1   Term  1   Preterm  0   AB  1   Living  1      SAB      IAB      Ectopic      Multiple  0   Live Births  1            Home Medications    Prior to Admission medications   Medication Sig Start Date End Date Taking?  Authorizing Provider  medroxyPROGESTERone  (DEPO-PROVERA ) 150 MG/ML injection Inject 1 mL (150 mg total) into the muscle every 3 (three) months. 07/08/18   Schuman, Claudell SAUNDERS, MD  risperiDONE  (RISPERDAL ) 3 MG tablet Take 1 tablet (3 mg total) by mouth at bedtime. 12/02/20   Clapacs, Norleen DASEN, MD  traZODone  (DESYREL ) 100 MG tablet Take 1 tablet (100 mg total) by mouth at bedtime as needed for sleep. 12/02/20   Clapacs, Norleen DASEN, MD    Family History History reviewed. No pertinent family history.  Social History Social History   Tobacco Use   Smoking status: Some Days    Current packs/day: 0.00    Types: Cigarettes    Last attempt to quit: 12/17/2017    Years since quitting: 6.5   Smokeless tobacco: Never  Vaping Use   Vaping status: Every Day  Substance Use Topics   Alcohol use: Yes    Comment: not frequently   Drug use: No     Allergies   Penicillins   Review of Systems Review of Systems  Gastrointestinal:  Positive for abdominal pain. Negative for nausea and vomiting.  Genitourinary:  Positive for vaginal discharge and vaginal pain. Negative for dysuria, frequency and urgency.  Skin:  Negative for rash.     Physical  Exam Triage Vital Signs ED Triage Vitals  Encounter Vitals Group     BP      Girls Systolic BP Percentile      Girls Diastolic BP Percentile      Boys Systolic BP Percentile      Boys Diastolic BP Percentile      Pulse      Resp      Temp      Temp src      SpO2      Weight      Height      Head Circumference      Peak Flow      Pain Score      Pain Loc      Pain Education      Exclude from Growth Chart    No data found.  Updated Vital Signs BP 119/83 (BP Location: Left Arm)   Pulse 88   Temp 97.7 F (36.5 C) (Oral)   Resp 16   LMP 06/09/2024 (Approximate)   SpO2 100%   Visual Acuity Right Eye Distance:   Left Eye Distance:   Bilateral Distance:    Right Eye Near:   Left Eye Near:    Bilateral Near:     Physical Exam Vitals  and nursing note reviewed.  Constitutional:      Appearance: Normal appearance.  Cardiovascular:     Rate and Rhythm: Normal rate and regular rhythm.     Pulses: Normal pulses.     Heart sounds: Normal heart sounds. No murmur heard.    No friction rub. No gallop.  Pulmonary:     Effort: Pulmonary effort is normal.     Breath sounds: Normal breath sounds. No wheezing, rhonchi or rales.  Abdominal:     General: Abdomen is flat.     Palpations: Abdomen is soft.     Tenderness: There is abdominal tenderness. There is no right CVA tenderness or left CVA tenderness.     Comments: Mild suprapubic tenderness.  Skin:    General: Skin is warm and dry.     Capillary Refill: Capillary refill takes less than 2 seconds.     Findings: No rash.  Neurological:     General: No focal deficit present.     Mental Status: She is alert and oriented to person, place, and time.      UC Treatments / Results  Labs (all labs ordered are listed, but only abnormal results are displayed) Labs Reviewed  HIV ANTIBODY (ROUTINE TESTING W REFLEX)  RPR  CERVICOVAGINAL ANCILLARY ONLY    EKG   Radiology No results found.  Procedures Procedures (including critical care time)  Medications Ordered in UC Medications  gentamicin  (GARAMYCIN ) injection 240 mg (has no administration in time range)  azithromycin  (ZITHROMAX ) tablet 2,000 mg (has no administration in time range)    Initial Impression / Assessment and Plan / UC Course  I have reviewed the triage vital signs and the nursing notes.  Pertinent labs & imaging results that were available during my care of the patient were reviewed by me and considered in my medical decision making (see chart for details).   Patient is a nontoxic-appearing 28 year old female presenting with request for STI testing in the setting of observing green discharge from her boyfriend's penis.  He also had significant dysuria this morning when he was voiding.  This was after  he had unprotected intercourse.  On exam patient has no CVA tenderness.  Her clinical exam is  benign.  She has mild suprapubic tenderness.  I will order a cytology swab to assess for gonorrhea, chlamydia, trichomonas, BV, and yeast.  She is also requesting blood work so I will order HIV and RPR.  I am going to treat her presumptively for both gonorrhea and chlamydia with 240 mg of gentamicin  and 2000 mg of azithromycin  followed by 7-day course of twice daily 100 mg doxycycline.   Final Clinical Impressions(s) / UC Diagnoses   Final diagnoses:  Possible exposure to STI     Discharge Instructions      We have treated you presumptively today for gonorrhea and chlamydia with an injection here in clinic.  Take the doxycycline 100 mg twice daily with food for 7 days to cover for potential chlamydia.  Your vaginal cytology swab will be back in the next 1 to 2 days, along with your blood work, and if you test positive for any additional infection she will be contacted by phone and treatment options will be provided.  If your results are negative they will appear in your MyChart.  I encourage you to encourage her boyfriend to be tested.  Abstain from intercourse, or at least unprotected intercourse, until after both of you have been tested and treated for your infections.     ED Prescriptions   None    PDMP not reviewed this encounter.   Bernardino Ditch, NP 06/29/24 1744

## 2024-06-30 LAB — CERVICOVAGINAL ANCILLARY ONLY
Bacterial Vaginitis (gardnerella): POSITIVE — AB
Candida Glabrata: NEGATIVE
Candida Vaginitis: NEGATIVE
Chlamydia: POSITIVE — AB
Comment: NEGATIVE
Comment: NEGATIVE
Comment: NEGATIVE
Comment: NEGATIVE
Comment: NEGATIVE
Comment: NORMAL
Neisseria Gonorrhea: POSITIVE — AB
Trichomonas: NEGATIVE

## 2024-06-30 LAB — RPR: RPR Ser Ql: NONREACTIVE

## 2024-07-01 ENCOUNTER — Ambulatory Visit (HOSPITAL_COMMUNITY): Payer: Self-pay

## 2024-07-01 MED ORDER — METRONIDAZOLE 500 MG PO TABS: 500.0000 mg | ORAL_TABLET | Freq: Two times a day (BID) | ORAL | 0 refills | Status: AC

## 2024-07-27 ENCOUNTER — Emergency Department
Admission: EM | Admit: 2024-07-27 | Discharge: 2024-07-28 | Disposition: A | Discharge status: Home / Self Care | Payer: MEDICAID | Attending: Emergency Medicine | Admitting: Emergency Medicine

## 2024-07-27 ENCOUNTER — Other Ambulatory Visit: Payer: Self-pay

## 2024-07-27 DIAGNOSIS — J069 Acute upper respiratory infection, unspecified: Secondary | ICD-10-CM | POA: Insufficient documentation

## 2024-07-27 DIAGNOSIS — J029 Acute pharyngitis, unspecified: Secondary | ICD-10-CM

## 2024-07-27 DIAGNOSIS — R051 Acute cough: Secondary | ICD-10-CM | POA: Diagnosis present

## 2024-07-27 LAB — URINALYSIS, ROUTINE W REFLEX MICROSCOPIC
Bilirubin Urine: NEGATIVE
Glucose, UA: NEGATIVE mg/dL
Hgb urine dipstick: NEGATIVE
Ketones, ur: 5 mg/dL — AB
Leukocytes,Ua: NEGATIVE
Nitrite: NEGATIVE
Protein, ur: NEGATIVE mg/dL
Specific Gravity, Urine: 1.023 (ref 1.005–1.030)
pH: 5 (ref 5.0–8.0)

## 2024-07-27 NOTE — ED Triage Notes (Signed)
 Patient ambulatory to triage with complaints of cough/congestion, chills overall not feeling well x2 days. Patient states she has been around the Flu at work. In triage patient also states her significant other has been unfaithful and she feels she may have gonorrhea and chlamydia. Wants to be tested.

## 2024-07-28 LAB — RESP PANEL BY RT-PCR (RSV, FLU A&B, COVID)  RVPGX2
Influenza A by PCR: NEGATIVE
Influenza B by PCR: NEGATIVE
Resp Syncytial Virus by PCR: NEGATIVE
SARS Coronavirus 2 by RT PCR: NEGATIVE

## 2024-07-28 LAB — CHLAMYDIA/NGC RT PCR (ARMC ONLY)
Chlamydia Tr: NOT DETECTED
N gonorrhoeae: NOT DETECTED

## 2024-07-28 NOTE — Discharge Instructions (Addendum)
 Your testing has come back negative for gonorrhea and chlamydia today

## 2024-07-28 NOTE — ED Provider Notes (Signed)
 [facility] Provider Note   Event Date/Time   First MD Initiated Contact with Patient 07/27/24 2314     (approximate) History  Cough  HPI Robin Wood is a 28 y.o. female with a past medical history of cocaine abuse and major depressive disorder with bipolar disorder as well who presents complaining of 2 days of cough, congestion, sore throat, and chills.  Patient does endorse sick contacts who have tested positive for the flu at work.  Patient also states that she is concerned that her partner has been unfaithful and that she may have gonorrhea and/or chlamydia.  Patient denies any dysuria or abnormal vaginal discharge. ROS: Patient currently denies any vision changes, tinnitus, difficulty speaking, facial droop, chest pain, shortness of breath, abdominal pain, nausea/vomiting/diarrhea, or weakness/numbness/paresthesias in any extremity   Physical Exam  Triage Vital Signs: ED Triage Vitals [07/27/24 2306]  Encounter Vitals Group     BP (!) 140/93     Girls Systolic BP Percentile      Girls Diastolic BP Percentile      Boys Systolic BP Percentile      Boys Diastolic BP Percentile      Pulse Rate 73     Resp 16     Temp 98.3 F (36.8 C)     Temp src      SpO2 97 %     Weight 98 lb (44.5 kg)     Height 5' 2 (1.575 m)     Head Circumference      Peak Flow      Pain Score 0     Pain Loc      Pain Education      Exclude from Growth Chart    Most recent vital signs: Vitals:   07/27/24 2306  BP: (!) 140/93  Pulse: 73  Resp: 16  Temp: 98.3 F (36.8 C)  SpO2: 97%   General: Awake, oriented x4. CV:  Good peripheral perfusion. Resp:  Normal effort. Abd:  No distention. Other:  Young adult well-developed, well-nourished African-American female resting comfortably in no acute distress.  Erythematous posterior oropharynx.  Injected tympanic membranes bilaterally without any purulent fluid collection or bulging ED Results / Procedures /  Treatments  Labs (all labs ordered are listed, but only abnormal results are displayed) Labs Reviewed  URINALYSIS, ROUTINE W REFLEX MICROSCOPIC - Abnormal; Notable for the following components:      Result Value   Color, Urine YELLOW (*)    APPearance CLEAR (*)    Ketones, ur 5 (*)    All other components within normal limits  RESP PANEL BY RT-PCR (RSV, FLU A&B, COVID)  [id number]  CHLAMYDIA/NGC RT PCR ([facility] ONLY)            PROCEDURES: Critical Care performed: No Procedures MEDICATIONS ORDERED IN ED: Medications - No data to display IMPRESSION / MDM / ASSESSMENT AND PLAN / ED COURSE  I reviewed the triage vital signs and the nursing notes.                             The patient is on the cardiac monitor to evaluate for evidence of arrhythmia and/or significant heart rate changes. Patient's presentation is most consistent with acute presentation with potential threat to life or bodily function. Patient is a 28 year old female with the above-stated past medical history that presents complaining of flulike symptoms including cough, congestion, and chills as well as concerns  for gonorrhea/chlamydia in the setting of an unfaithful partner. DDx: Flu, COVID, RSV, URI, bronchitis, pharyngitis, gonorrhea/chlamydia infection Plan: UA and respiratory viral panel showed no evidence of acute abnormalities GC/chlamydia-negative  Symptoms likely consistent with upper respiratory infection.  Discussed symptomatic control with patient, strict return precautions, and follow-up with her primary care physician.  All questions were answered prior to discharge Dispo: Discharge home with PCP follow-up   FINAL CLINICAL IMPRESSION(S) / ED DIAGNOSES   Final diagnoses:  Acute cough  Pharyngitis, unspecified etiology  Viral URI with cough   Rx / DC Orders   ED Discharge Orders     None      Note:  This document was prepared using Dragon voice recognition software and may include unintentional  dictation errors.   Linnette Panella K, MD 07/28/24 [phone]
